# Patient Record
Sex: Female | Born: 1965 | Race: Black or African American | Hispanic: No | Smoking: Current every day smoker
Health system: Southern US, Community
[De-identification: ages and names within clinical notes are randomized; demographics above are authoritative.]

## PROBLEM LIST (undated history)

## (undated) DIAGNOSIS — F419 Anxiety disorder, unspecified: Secondary | ICD-10-CM

## (undated) DIAGNOSIS — F329 Major depressive disorder, single episode, unspecified: Secondary | ICD-10-CM

## (undated) DIAGNOSIS — F32A Depression, unspecified: Secondary | ICD-10-CM

## (undated) HISTORY — PX: HERNIA REPAIR: SHX51

---

## 1997-11-17 ENCOUNTER — Emergency Department (HOSPITAL_COMMUNITY): Admission: EM | Admit: 1997-11-17 | Discharge: 1997-11-17 | Payer: Self-pay | Admitting: Emergency Medicine

## 1997-11-19 ENCOUNTER — Encounter: Payer: Self-pay | Admitting: *Deleted

## 1997-11-19 ENCOUNTER — Emergency Department (HOSPITAL_COMMUNITY): Admission: EM | Admit: 1997-11-19 | Discharge: 1997-11-19 | Payer: Self-pay | Admitting: Emergency Medicine

## 1998-08-11 ENCOUNTER — Other Ambulatory Visit: Admission: RE | Admit: 1998-08-11 | Discharge: 1998-08-11 | Payer: Self-pay | Admitting: Family Medicine

## 1999-03-08 ENCOUNTER — Emergency Department (HOSPITAL_COMMUNITY): Admission: EM | Admit: 1999-03-08 | Discharge: 1999-03-08 | Payer: Self-pay | Admitting: Emergency Medicine

## 2000-04-03 ENCOUNTER — Ambulatory Visit (HOSPITAL_COMMUNITY): Admission: RE | Admit: 2000-04-03 | Discharge: 2000-04-03 | Payer: Self-pay | Admitting: General Surgery

## 2000-04-03 ENCOUNTER — Encounter (HOSPITAL_BASED_OUTPATIENT_CLINIC_OR_DEPARTMENT_OTHER): Payer: Self-pay | Admitting: General Surgery

## 2000-04-17 ENCOUNTER — Encounter (HOSPITAL_BASED_OUTPATIENT_CLINIC_OR_DEPARTMENT_OTHER): Payer: Self-pay | Admitting: General Surgery

## 2000-04-19 ENCOUNTER — Ambulatory Visit (HOSPITAL_COMMUNITY): Admission: RE | Admit: 2000-04-19 | Discharge: 2000-04-20 | Payer: Self-pay | Admitting: General Surgery

## 2000-09-05 ENCOUNTER — Emergency Department (HOSPITAL_COMMUNITY): Admission: EM | Admit: 2000-09-05 | Discharge: 2000-09-05 | Payer: Self-pay | Admitting: Emergency Medicine

## 2000-09-05 ENCOUNTER — Encounter: Payer: Self-pay | Admitting: Emergency Medicine

## 2000-10-08 ENCOUNTER — Emergency Department (HOSPITAL_COMMUNITY): Admission: EM | Admit: 2000-10-08 | Discharge: 2000-10-09 | Payer: Self-pay | Admitting: Emergency Medicine

## 2001-04-30 ENCOUNTER — Other Ambulatory Visit: Admission: RE | Admit: 2001-04-30 | Discharge: 2001-04-30 | Payer: Self-pay | Admitting: Obstetrics

## 2001-05-08 ENCOUNTER — Ambulatory Visit (HOSPITAL_COMMUNITY): Admission: RE | Admit: 2001-05-08 | Discharge: 2001-05-08 | Payer: Self-pay | Admitting: Obstetrics

## 2001-05-08 ENCOUNTER — Encounter: Payer: Self-pay | Admitting: Obstetrics

## 2001-06-17 ENCOUNTER — Emergency Department (HOSPITAL_COMMUNITY): Admission: EM | Admit: 2001-06-17 | Discharge: 2001-06-17 | Payer: Self-pay

## 2001-08-19 ENCOUNTER — Encounter: Payer: Self-pay | Admitting: Family Medicine

## 2001-08-19 ENCOUNTER — Encounter: Admission: RE | Admit: 2001-08-19 | Discharge: 2001-08-19 | Payer: Self-pay | Admitting: Family Medicine

## 2001-09-02 ENCOUNTER — Ambulatory Visit (HOSPITAL_COMMUNITY): Admission: RE | Admit: 2001-09-02 | Discharge: 2001-09-02 | Payer: Self-pay | Admitting: General Surgery

## 2001-10-17 ENCOUNTER — Encounter: Admission: RE | Admit: 2001-10-17 | Discharge: 2001-10-17 | Payer: Self-pay | Admitting: Family Medicine

## 2001-10-17 ENCOUNTER — Encounter: Payer: Self-pay | Admitting: Family Medicine

## 2002-03-06 ENCOUNTER — Inpatient Hospital Stay (HOSPITAL_COMMUNITY): Admission: EM | Admit: 2002-03-06 | Discharge: 2002-03-08 | Payer: Self-pay | Admitting: *Deleted

## 2002-10-08 ENCOUNTER — Other Ambulatory Visit: Admission: RE | Admit: 2002-10-08 | Discharge: 2002-10-08 | Payer: Self-pay | Admitting: Family Medicine

## 2003-03-24 ENCOUNTER — Encounter: Admission: RE | Admit: 2003-03-24 | Discharge: 2003-03-24 | Payer: Self-pay | Admitting: Family Medicine

## 2003-08-13 ENCOUNTER — Ambulatory Visit (HOSPITAL_BASED_OUTPATIENT_CLINIC_OR_DEPARTMENT_OTHER): Admission: RE | Admit: 2003-08-13 | Discharge: 2003-08-13 | Payer: Self-pay | Admitting: Family Medicine

## 2004-01-28 ENCOUNTER — Other Ambulatory Visit: Admission: RE | Admit: 2004-01-28 | Discharge: 2004-01-28 | Payer: Self-pay | Admitting: Family Medicine

## 2004-07-02 ENCOUNTER — Emergency Department (HOSPITAL_COMMUNITY): Admission: EM | Admit: 2004-07-02 | Discharge: 2004-07-02 | Payer: Self-pay | Admitting: Family Medicine

## 2005-04-18 ENCOUNTER — Other Ambulatory Visit: Admission: RE | Admit: 2005-04-18 | Discharge: 2005-04-18 | Payer: Self-pay | Admitting: Family Medicine

## 2005-05-10 ENCOUNTER — Encounter: Payer: Self-pay | Admitting: Obstetrics

## 2006-06-22 ENCOUNTER — Encounter: Admission: RE | Admit: 2006-06-22 | Discharge: 2006-06-22 | Payer: Self-pay | Admitting: Orthopedic Surgery

## 2006-10-27 ENCOUNTER — Emergency Department (HOSPITAL_COMMUNITY): Admission: EM | Admit: 2006-10-27 | Discharge: 2006-10-27 | Payer: Self-pay | Admitting: *Deleted

## 2006-12-11 ENCOUNTER — Emergency Department (HOSPITAL_COMMUNITY): Admission: EM | Admit: 2006-12-11 | Discharge: 2006-12-11 | Payer: Self-pay | Admitting: Emergency Medicine

## 2008-10-05 ENCOUNTER — Encounter: Admission: RE | Admit: 2008-10-05 | Discharge: 2008-10-05 | Payer: Self-pay | Admitting: Family Medicine

## 2009-05-06 ENCOUNTER — Encounter: Admission: RE | Admit: 2009-05-06 | Discharge: 2009-05-06 | Payer: Self-pay | Admitting: Family Medicine

## 2010-05-27 NOTE — Procedures (Signed)
NAME:  Nancy Santiago, Nancy Santiago       ACCOUNT NO.:  [id number]   MEDICAL RECORD NO.:  [id number]          PATIENT TYPE:  OUT   LOCATION:  SLEEP CENTER                 FACILITY:  [facility]   PHYSICIAN:  Clinton D. Maple Hudson, M.D. DATE OF BIRTH:  Jun 28, 1965   DATE OF ADMISSION:  08/13/2003  DATE OF DISCHARGE:  08/13/2003                              NOCTURNAL POLYSOMNOGRAM   REFERRING PHYSICIAN:  Renaye Rakers, M.D.   INDICATION FOR STUDY AND HISTORY:  Hypersomnia with sleep apnea, complaints  of snoring, night sweats, nightmares, difficulty initiating and maintaining  sleep, nonrestorative sleep, vivid dreams.   MEDICATIONS:  Valtrex, cranberry pill, Zoloft, multivitamins.   Epworth sleepiness score 2/24, neck size 18.2 inches, weight 107 pounds.   SLEEP ARCHITECTURE:  Total sleep time 364 minutes for sleep efficiency of  90%.  Stage I was 4%; stage II, 75%; stages III and IV were absent.  REM was  12% of total sleep time.  Latency to sleep onset was 11 minutes, latency to  REM 331 minutes, awake after sleep onset 28 minutes.  Arousal index 37.5  times per hour.  Arousals were mostly nonspecific, not clearly associated  with triggering events.  Technician noted that patient wanted to sleep under  multiple blankets, but technician also commented that patient appeared to be  too warm with sweating causing PEG artifact.  It may be able to approach  sleep hygiene on this issue.  Also note that work schedule gets rough at  3:30 a.m. on work days.   RESPIRATORY DATA:  Occasional sleep disorder breathing event within normal  limits. RDI was 1.6 per hour reflecting 8 hypopneas and 2 obstructive  apneas.  This is not felt to require medical attention.  Events were not  positional.  REM RDI was 1.3 per hour.   OXYGEN DATA:  Mild to moderate snoring noted occasionally.  Normal  oxygenation with mean saturation through the study between 96 and 97%.   CARDIAC DATA:  Normal cardiac rhythm, no significant  ectopics.   MOVEMENT AND PARASOMNIA:  There were 55 body jerks recorded of which 22 were  associated with arousals for a periodic limb movement with arousal index of  3.6 per hour. This may indicate mild  periodic limb movement syndrome.   IMPRESSION AND RECOMMENDATIONS:  A. Note comments above on sleep hygiene and impact of sleep schedule.  B. No significant sleep disorder breathing event.  C. Normal oxygenation and cardiac rhythm.  D. Periodic limb movement with arousal, 3.6 times per hour.  It may help to     treat this specifically; for example,     with Mirapex or Klonopin.  It may also be possible to treat this as part     of a nonspecific insomnia complaint with a standard sedative hypnotic.                                   ______________________________                                Rennis Chris. Maple Hudson, M.D.  Diplomate, American Board of Sleep Medicine    CDY/MEDQ  D:  08/16/2003 13:07:59  T:  08/17/2003 23:15:53  Job:  [id number]

## 2010-05-27 NOTE — Op Note (Signed)
   NAME:  Nancy Santiago, DEDEAUX                 ACCOUNT NO.:  [id number]   MEDICAL RECORD NO.:  [id number]                   PATIENT TYPE:  OIB   LOCATION:  2899                                 FACILITY:  [facility]   PHYSICIAN:  Mardene Celeste. Lurene Shadow, M.D.             DATE OF BIRTH:  [birth year]   DATE OF PROCEDURE:  09/02/2001  DATE OF DISCHARGE:                                 OPERATIVE REPORT   PREOPERATIVE DIAGNOSIS:  Suture granuloma of umbilicus with contained  sutures.   POSTOPERATIVE DIAGNOSIS:  Suture granuloma of umbilicus with contained  sutures.   PROCEDURE:  Excision of suture granuloma and removal of sutures from the  umbilicus.   SURGEON:  Mardene Celeste. Lurene Shadow, M.D.   ASSISTANT:  Nurse.   ANESTHESIA:  General.   CLINICAL NOTE:  The patient is a 45 year old woman who underwent repair of  umbilical hernia in the past.  She returns now with painful umbilicus and a  nodule within the umbilicus felt to be a suture granuloma.  The risks and  benefits of surgery have been discussed.  She understands and gives consent.  She is brought now to the operating room for excision of umbilical mass.   DESCRIPTION OF PROCEDURE:  Following the induction of satisfactory general  anesthesia, the patient was positioned supinely and the abdomen is prepped  and draped routinely.  I made an elliptical incision around the region of  the umbilical mass, deepened this through the skin and subcutaneous tissue,  taking the dissection down to the previous hernia repair.  The entire  granuloma was excised and then four individual sutures were removed.  The  fascia remained intact.  Hemostasis was obtained with electrocautery.  Sponge and instrument counts were then verified.  The subcutaneous tissue is  closed with interrupted 2-0 Vicryl sutures and the skin closed with a  running Monocryl suture and then reinforced with Steri-Strips.  Sterile  dressings applied.  The anesthetic reversed.  The patient  removed from the  operating room to recovery room in stable condition.  She tolerated the  procedure well.                                               Mardene Celeste Lurene Shadow, M.D.    PLB/MEDQ  D:  09/02/2001  T:  09/04/2001  Job:  [id number]

## 2010-05-27 NOTE — Op Note (Signed)
[place]  Patient:    Nancy Santiago, Nancy Santiago                MRN: [id number]. Date: 04/19/00 Attending:  Luisa Hart L. Lurene Shadow, M.D.                           Operative Report  PREOPERATIVE DIAGNOSIS:  Ventral hernia.  POSTOPERATIVE DIAGNOSIS:  Ventral hernia.  OPERATION PERFORMED:  Repair of ventral hernia.  SURGEON:  Mardene Celeste. Lurene Shadow, M.D.  ASSISTANT:  Nurse.  ANESTHESIA:  General.  INDICATIONS FOR PROCEDURE:  The patient is a 45 year old woman who is status post a laparoscopy who presents now with a defect just underneath the umbilicus.  It has just become painful with several episodes of incarceration of what I thought to be omentum.  She has come to the operating room now for repair.  DESCRIPTION OF PROCEDURE:  Following the induction of anesthesia with the patient positioned supinely, the abdomen was prepped and draped routinely.  I made an infraumbilical incision which was deepened through the skin and subcutaneous tissues dissecting down along the umbilical skin down to the hernial sac which was opened.  She had a defect which was approximately the size of the tip of the thumb.  The abdomen was quite lax and there was very little tension between the edges of the sac, so I decided not to use a mesh. I used interrupted inverted mattress sutures of 0 Novofil to repair the hernia, this being repaired in approximately four to five stitches.  Sponge, instrument and sharp counts were verified.  Skin closed with a running suture of #4-0 Monocryl.  Sterile dressings applied.  Anesthetic reversed.  Patient removed from the operating room to the recovery room in stable condition having tolerated the procedure well. DD:  04/19/00 TD:  04/19/00 Job: [id number]

## 2010-05-27 NOTE — Discharge Summary (Signed)
NAME:  Nancy Santiago, IMHOFF NO.:  [id number]   MEDICAL RECORD NO.:  [id number]                   PATIENT TYPE:  IPS   LOCATION:  0300                                 FACILITY:  [facility]   PHYSICIAN:  Jeanice Lim, M.D.              DATE OF BIRTH:  Jul 11, 1965   DATE OF ADMISSION:  03/06/2002  DATE OF DISCHARGE:  03/08/2002                                 DISCHARGE SUMMARY   IDENTIFYING DATA:  This is a 45 year old African-American female, married,  voluntarily admitted after an overdose with Ambien after arguing with her  husband.  She had lost her job on January 24, 2002 for saying that her  supervisor had lied.  As per patient, this is the first psychiatric  admission.  The patient described irritability, anger outbursts throughout  life and mood lability.   MEDICATIONS:  Effexor XR 150 mg q.a.m. and Famvir.   ALLERGIES:  No known drug allergies.   PHYSICAL EXAMINATION:  Essentially within normal limits.  Neurologically  nonfocal.   LABORATORY DATA:  Routine admission labs essentially within normal limits.   MENTAL STATUS EXAM:  Petite female with grumbling abdomen secondary to  charcoal.  Tearful affect.  Poor eye contact.  Speech within normal limits.  Mood depressed.  Affect irritable.  Thought processes goal directed.  Thought content negative for dangerous ideation, questionable suicidal  ideation, somewhat ambivalent and cognition was intact.  Judgment and  insight fair.   ADMISSION DIAGNOSES:   AXIS I:  1. Depressive disorder not otherwise specified.  2. Rule out bipolar disorder, type 2.   AXIS II:  None.   AXIS III:  1. Status post overdose.  2. Herpes II.   AXIS IV:  Moderate (stressors related to occupation and marriage).   AXIS V:  30/65.   HOSPITAL COURSE:  The patient was admitted and ordered routine p.r.n.  medications and underwent further monitoring.  Was encouraged to participate  in individual, group and milieu  therapy.  The patient was optimized on  Effexor targeting depressive symptoms and monitored for mood lability.  The  patient reported regretting the overdose, had wanted to hurt the husband  after the argument and this was an impulsive act, as per patient.  The  patient demonstrated safe behaviors while on the unit, participating in  treatment and reported a positive response to clinical intervention.  The  session with the husband went well.   CONDITION ON DISCHARGE:  Improved.  Mood was more euthymic.  Affect  brighter.  Thought processes goal directed.  Thought content negative for  dangerous ideation or psychotic symptoms.  The patient reported motivation  to be compliant with the aftercare plan.   DISCHARGE MEDICATIONS:  1. Effexor XR 150 mg q.a.m.  2. Effexor XR 37.5 mg at 3 p.m.   FOLLOW UP:  The patient was to follow up for medication management in 7-10  days.   DISCHARGE DIAGNOSES:   AXIS I:  1.  Depressive disorder not otherwise specified.  2. Rule out bipolar disorder, type 2.   AXIS II:  None.   AXIS III:  1. Status post overdose.  2. Herpes II.   AXIS IV:  Moderate (stressors related to occupation and marriage).   AXIS V:  Global Assessment of Functioning on discharge 55.                                               Jeanice Lim, M.D.    JEM/MEDQ  D:  03/17/2002  T:  03/18/2002  Job:  [id number]

## 2011-09-07 ENCOUNTER — Other Ambulatory Visit: Payer: Self-pay | Admitting: Family Medicine

## 2011-09-07 ENCOUNTER — Ambulatory Visit
Admission: RE | Admit: 2011-09-07 | Discharge: 2011-09-07 | Disposition: A | Discharge status: Home / Self Care | Payer: BC Managed Care – PPO | Source: Ambulatory Visit | Attending: Family Medicine | Admitting: Family Medicine

## 2011-09-07 DIAGNOSIS — M898X9 Other specified disorders of bone, unspecified site: Secondary | ICD-10-CM

## 2012-07-19 ENCOUNTER — Other Ambulatory Visit (HOSPITAL_COMMUNITY)
Admission: RE | Admit: 2012-07-19 | Discharge: 2012-07-19 | Disposition: A | Discharge status: Home / Self Care | Payer: BC Managed Care – PPO | Source: Ambulatory Visit | Attending: Family Medicine | Admitting: Family Medicine

## 2012-07-19 DIAGNOSIS — Z01419 Encounter for gynecological examination (general) (routine) without abnormal findings: Secondary | ICD-10-CM | POA: Insufficient documentation

## 2012-07-19 DIAGNOSIS — Z1151 Encounter for screening for human papillomavirus (HPV): Secondary | ICD-10-CM | POA: Insufficient documentation

## 2013-08-11 ENCOUNTER — Emergency Department (HOSPITAL_COMMUNITY)
Admission: EM | Admit: 2013-08-11 | Discharge: 2013-08-11 | Disposition: A | Discharge status: Home / Self Care | Payer: BC Managed Care – PPO | Attending: Emergency Medicine | Admitting: Emergency Medicine

## 2013-08-11 ENCOUNTER — Encounter (HOSPITAL_COMMUNITY): Payer: Self-pay | Admitting: Emergency Medicine

## 2013-08-11 ENCOUNTER — Emergency Department (HOSPITAL_COMMUNITY): Payer: BC Managed Care – PPO

## 2013-08-11 ENCOUNTER — Emergency Department (HOSPITAL_COMMUNITY)
Admission: EM | Admit: 2013-08-11 | Discharge: 2013-08-11 | Disposition: A | Discharge status: Nursing Home (Includes ICF) | Payer: BC Managed Care – PPO | Source: Home / Self Care | Attending: Emergency Medicine | Admitting: Emergency Medicine

## 2013-08-11 DIAGNOSIS — S2341XA Sprain of ribs, initial encounter: Secondary | ICD-10-CM | POA: Insufficient documentation

## 2013-08-11 DIAGNOSIS — Y929 Unspecified place or not applicable: Secondary | ICD-10-CM | POA: Insufficient documentation

## 2013-08-11 DIAGNOSIS — F17200 Nicotine dependence, unspecified, uncomplicated: Secondary | ICD-10-CM | POA: Insufficient documentation

## 2013-08-11 DIAGNOSIS — Y939 Activity, unspecified: Secondary | ICD-10-CM | POA: Insufficient documentation

## 2013-08-11 DIAGNOSIS — X58XXXA Exposure to other specified factors, initial encounter: Secondary | ICD-10-CM | POA: Insufficient documentation

## 2013-08-11 DIAGNOSIS — Z79899 Other long term (current) drug therapy: Secondary | ICD-10-CM | POA: Insufficient documentation

## 2013-08-11 DIAGNOSIS — R079 Chest pain, unspecified: Secondary | ICD-10-CM | POA: Insufficient documentation

## 2013-08-11 DIAGNOSIS — S29011A Strain of muscle and tendon of front wall of thorax, initial encounter: Secondary | ICD-10-CM

## 2013-08-11 DIAGNOSIS — Z8659 Personal history of other mental and behavioral disorders: Secondary | ICD-10-CM | POA: Insufficient documentation

## 2013-08-11 HISTORY — DX: Depression, unspecified: F32.A

## 2013-08-11 HISTORY — DX: Anxiety disorder, unspecified: F41.9

## 2013-08-11 HISTORY — DX: Major depressive disorder, single episode, unspecified: F32.9

## 2013-08-11 LAB — CBC
HCT: 37.9 % (ref 36.0–46.0)
Hemoglobin: 12.5 g/dL (ref 12.0–15.0)
MCH: 29.8 pg (ref 26.0–34.0)
MCHC: 33 g/dL (ref 30.0–36.0)
MCV: 90.5 fL (ref 78.0–100.0)
Platelets: 164 10*3/uL (ref 150–400)
RBC: 4.19 MIL/uL (ref 3.87–5.11)
RDW: 13.5 % (ref 11.5–15.5)
WBC: 8.4 10*3/uL (ref 4.0–10.5)

## 2013-08-11 LAB — BASIC METABOLIC PANEL
Anion gap: 11 (ref 5–15)
BUN: 11 mg/dL (ref 6–23)
CO2: 22 mEq/L (ref 19–32)
CREATININE: 0.72 mg/dL (ref 0.50–1.10)
Calcium: 8.4 mg/dL (ref 8.4–10.5)
Chloride: 107 mEq/L (ref 96–112)
Glucose, Bld: 83 mg/dL (ref 70–99)
POTASSIUM: 4.3 meq/L (ref 3.7–5.3)
Sodium: 140 mEq/L (ref 137–147)

## 2013-08-11 LAB — I-STAT TROPONIN, ED: Troponin i, poc: 0 ng/mL (ref 0.00–0.08)

## 2013-08-11 MED ORDER — NITROGLYCERIN 0.4 MG SL SUBL
SUBLINGUAL_TABLET | SUBLINGUAL | Status: AC
  Filled 2013-08-11: qty 1

## 2013-08-11 MED ORDER — SODIUM CHLORIDE 0.9 % IV SOLN
INTRAVENOUS | Status: DC
  Administered 2013-08-11: 11:00:00 via INTRAVENOUS

## 2013-08-11 MED ORDER — ASPIRIN 81 MG PO CHEW
CHEWABLE_TABLET | ORAL | Status: AC
  Filled 2013-08-11: qty 4

## 2013-08-11 MED ORDER — ASPIRIN 81 MG PO CHEW
324.0000 mg | CHEWABLE_TABLET | Freq: Once | ORAL | Status: AC
  Administered 2013-08-11: 324 mg via ORAL

## 2013-08-11 MED ORDER — HYDROCODONE-ACETAMINOPHEN 5-325 MG PO TABS: 1.0000 | ORAL_TABLET | Freq: Four times a day (QID) | ORAL | Status: DC | PRN

## 2013-08-11 MED ORDER — KETOROLAC TROMETHAMINE 30 MG/ML IJ SOLN
30.0000 mg | Freq: Once | INTRAMUSCULAR | Status: AC
  Administered 2013-08-11: 30 mg via INTRAVENOUS
  Filled 2013-08-11: qty 1

## 2013-08-11 MED ORDER — IBUPROFEN 800 MG PO TABS: 800.0000 mg | ORAL_TABLET | Freq: Three times a day (TID) | ORAL | Status: DC | PRN

## 2013-08-11 MED ORDER — NITROGLYCERIN 0.4 MG SL SUBL: 0.4000 mg | SUBLINGUAL_TABLET | SUBLINGUAL | Status: DC | PRN

## 2013-08-11 NOTE — ED Notes (Signed)
Patient c/o left side chest pain x 3 weeks. Patient reports pain is worst when she lifts her arms. Patient reports she lifts her grandson everyday up three flights of stairs and he's about 20-25 lbs. Patient is alert and oriented and in no acute distress.

## 2013-08-11 NOTE — ED Notes (Signed)
Patient placed on cardiac monitor, refused O2 asked that it be taken off due to making her "head feel funny"

## 2013-08-11 NOTE — Discharge Instructions (Signed)
Return here as needed. Follow up with your doctor. °

## 2013-08-11 NOTE — Discharge Instructions (Signed)
We have determined that your problem requires further evaluation in the emergency department.  We will take care of your transport there.  Once at the emergency department, you will be evaluated by a provider and they will order whatever treatment or tests they deem necessary.  We cannot guarantee that they will do any specific test or do any specific treatment.  ° °

## 2013-08-11 NOTE — ED Provider Notes (Signed)
Chief Complaint   Chief Complaint  Patient presents with  . Chest Pain    History of Present Illness    Nancy Santiago is a 48 year old female cigarette smoker whose had a three-week history of continuous but waxing and waning chest discomfort. The pain is localized to the entire left hemithorax. It's described as an 8/10 in intensity. She denies any injury. The pain is worse if she abducts her left arm and also in the morning when she first wakes up. The pain is nonexertional and nonpleuritic. She has had some cough productive of brown to green sputum. She denies any fever, chills, URI symptoms, or wheezing. She denies any shortness of breath at rest, but is a little bit more short of breath when she goes up 3 flights of stairs carrying her baby. She denies any other cardiac symptoms such as palpitations, dizziness, or syncope. No abdominal pain, nausea, vomiting, indigestion, heartburn. No extremity pain, paresthesias, swelling, or history of DVT or blood clots.  Review of Systems    Other than noted above, the patient denies any of the following symptoms. Systemic:  No fever or chills. Pulmonary:  No cough, wheezing, shortness of breath, sputum production, hemoptysis. Cardiac:  No palpitations, rapid heartbeat, dizziness, presyncope or syncope. GI:  No abdominal pain, heartburn, nausea, or vomiting. Ext:  No leg pain or swelling.  [place]    Past medical history, family history, social history, meds, and allergies were reviewed. Her only medication is Valtrex. She is a cigarette smoker.  Physical Exam     Vital signs:  BP 115/68  Pulse 76  Temp(Src) 98.4 F (36.9 C) (Oral)  Resp 18  SpO2 100% Gen:  Alert, oriented, in no distress, skin warm and dry. Eye:  PERRL, lids and conjunctivas normal.  Sclera non-icteric. ENT:  Mucous membranes moist, pharynx clear. Neck:  Supple, no adenopathy or tenderness.  No JVD. Lungs:  Clear to auscultation, no wheezes, rales or rhonchi.   No respiratory distress. Heart:  Regular rhythm.  No gallops, murmers, clicks or rubs. Chest:  No chest wall tenderness. Abdomen:  Soft, with mild tenderness to palpation in epigastrium and left upper quadrant with no guarding or rebound, no organomegaly or mass.  Bowel sounds normal.  No pulsatile abdominal mass or bruit. Ext:  No edema.  No calf tenderness and Homann's sign negative.  Pulses full and equal. Skin:  Warm and dry.  No rash.  EKG Results:  Date: 08/11/2013  Rate: 64  Rhythm: normal sinus rhythm  QRS Axis: normal--42  Intervals: normal  ST/T Wave abnormalities: nonspecific T wave changes  Conduction Disutrbances:none  Narrative Interpretation: Normal sinus rhythm, nonspecific T wave abnormalities.  Old EKG Reviewed: none available    Course in Urgent [place]         Began on IV normal saline, monitor, and nasal O2. Given nitroglycerin 0.4 mg sublingually and aspirin 325 mg by mouth.  Assessment     The encounter diagnosis was Chest pain, unspecified chest pain type.  Differential diagnosis is acute coronary syndrome, pulmonary embolism, ruptured aneurysm, pneumothorax, Boerhaave syndrome, pericarditis, musculoskeletal pain, or reflux esophagitis.   Plan     The patient was transferred to the ED via [place] in stable condition.  Medical Decision Making:  48 year old female smoker has a 3 week history of constant left hemithorax pain.  Non-exertional and non-pleuritic.  No shortness of breath, diaphoresis or nausea.  EKG shows non-specific T changes.  No prior history of heart disease, DVT, or PE.  Will give TNG, ASA, and transfer via [facility].        Harden Mo, MD 08/11/13 1051

## 2013-08-11 NOTE — ED Provider Notes (Signed)
CSN: [id number]     Arrival date & time 08/11/13  1136 History   First MD Initiated Contact with Patient 08/11/13 1219     Chief Complaint  Patient presents with  . Chest Pain     (Consider location/radiation/quality/duration/timing/severity/associated sxs/prior Treatment) HPI Patient presents to the emergency department with chest pain, has been constant for the last 3 weeks.  The patient, states, there's been no resolution at a time with her pain.  The patient, states, that she has not had any shortness of breath, headache, blurred vision, weakness, dizziness, back pain, neck pain, fever, cough, sore throat, runny nose nausea, vomiting, diarrhea, rash, dysuria, or syncope.  The patient, states, that movement makes the pain, worse.  Patient, states, that doesn't take any medications prior to arrival.  Nothing seems make her condition, better    Past Medical History  Diagnosis Date  . Depression   . Anxiety    Past Surgical History  Procedure Laterality Date  . Hernia repair     No family history on file. History  Substance Use Topics  . Smoking status: Current Every Day Smoker -- 0.50 packs/day    Types: Cigarettes  . Smokeless tobacco: Not on file  . Alcohol Use: Yes     Comment: occasional   OB History   Grav Para Term Preterm Abortions TAB SAB Ect Mult Living                 Review of Systems  All other systems negative except as documented in the HPI. All pertinent positives and negatives as reviewed in the HPI. All other systems negative except as documented in the HPI. All pertinent positives and negatives as reviewed in the HPI.  Allergies  Review of patient's allergies indicates no known allergies.  Home Medications   Prior to Admission medications   Medication Sig Start Date End Date Taking? Authorizing Provider  aspirin-acetaminophen-caffeine (EXCEDRIN MIGRAINE) [phone] MG per tablet Take 2 tablets by mouth every 6 (six) hours as needed for headache.    Yes Historical Provider, MD  ibuprofen (ADVIL,MOTRIN) 200 MG tablet Take 400 mg by mouth every 6 (six) hours as needed for moderate pain.   Yes Historical Provider, MD  valACYclovir (VALTREX) 500 MG tablet Take 500 mg by mouth daily.   Yes Historical Provider, MD   BP 92/57  Pulse 69  Temp(Src) 98 F (36.7 C) (Oral)  Resp 16  Ht 5' 4.5" (1.638 m)  Wt 125 lb (56.7 kg)  BMI 21.13 kg/m2  SpO2 98% Physical Exam  Constitutional: She is oriented to person, place, and time. She appears well-developed and well-nourished. No distress.  HENT:  Head: Normocephalic and atraumatic.  Mouth/Throat: Oropharynx is clear and moist.  Eyes: Pupils are equal, round, and reactive to light.  Neck: Normal range of motion. Neck supple.  Cardiovascular: Normal rate, regular rhythm and normal heart sounds.  Exam reveals no gallop and no friction rub.   No murmur heard. Pulmonary/Chest: Effort normal and breath sounds normal. She exhibits tenderness.    Neurological: She is alert and oriented to person, place, and time. She exhibits normal muscle tone. Coordination normal.  Skin: Skin is warm and dry. No erythema.    ED Course  Procedures (including critical care time) Labs Review Labs Reviewed  [place], ED    Imaging Review Dg Chest 2 View  08/11/2013   CLINICAL DATA:  Chest pain for 3 weeks radiating to left arm.  EXAM: CHEST  2 VIEW  COMPARISON:  None.  FINDINGS: Cardiomediastinal silhouette is unremarkable. The lungs are clear without pleural effusions or focal consolidations. Trachea projects midline and there is no pneumothorax. Soft tissue planes and included osseous structures are non-suspicious.  IMPRESSION: No acute cardiopulmonary process.  Normal chest radiograph.   Electronically Signed   By: Elon Alas   On: 08/11/2013 13:17     EKG Interpretation   Date/Time:  Monday August 11 2013 11:45:25 EDT Ventricular Rate:  60 PR Interval:  140 QRS  Duration: 82 QT Interval:  412 QTC Calculation: 412 R Axis:   11 Text Interpretation:  Sinus rhythm Borderline T wave abnormalities No  significant change since last tracing Confirmed by HARRISON  MD, FORREST  ([id number]) on 08/11/2013 11:49:22 AM        Patient Ms. Lopez musculoskeletal chest pain, based on the fact, that it's been constant over 3 weeks and she notices it more with movements.  Patient has been doing heavy lifting as well.  Patient's best return here as needed.  To followup with her primary care Dr. for urgent care.  Her testing here today did not show any signs of any cardiac involvement patient is PERC negative.  Brent General, PA-C 08/13/13 1955

## 2013-08-11 NOTE — ED Notes (Signed)
Pt [facility] transfer. Pt has CP x3 weeks, constant pain. 8/10, radiates to left arm. Given 1 nitro and 324 asa by UC, 500cc NS by EMS, no change in pain. Pt is AAOX4, in NAD, denies N/V/D, Diaphoresis, or SOB. Skin warm and dry.

## 2013-08-14 NOTE — ED Provider Notes (Signed)
Medical screening examination/treatment/procedure(s) were conducted as a shared visit with non-physician practitioner(s) and myself.  I personally evaluated the patient during the encounter.   EKG Interpretation   Date/Time:  Monday August 11 2013 11:45:25 EDT Ventricular Rate:  60 PR Interval:  140 QRS Duration: 82 QT Interval:  412 QTC Calculation: 412 R Axis:   11 Text Interpretation:  Sinus rhythm Borderline T wave abnormalities No  significant change since last tracing Confirmed by Ruhee Enck  MD, Taneal Sonntag  ([id number]) on 08/11/2013 11:49:22 AM      I interviewed and examined the patient. Lungs are CTAB. Cardiac exam wnl. Abdomen soft.  CP atypical and reproducible. Low risk per HEART score for MACE.   Pamella Pert, MD 08/14/13 1224

## 2013-11-07 ENCOUNTER — Encounter (HOSPITAL_BASED_OUTPATIENT_CLINIC_OR_DEPARTMENT_OTHER): Payer: Self-pay | Admitting: Emergency Medicine

## 2013-11-07 ENCOUNTER — Emergency Department (HOSPITAL_BASED_OUTPATIENT_CLINIC_OR_DEPARTMENT_OTHER)
Admission: EM | Admit: 2013-11-07 | Discharge: 2013-11-07 | Disposition: A | Discharge status: Home / Self Care | Payer: BC Managed Care – PPO | Attending: Emergency Medicine | Admitting: Emergency Medicine

## 2013-11-07 DIAGNOSIS — Z720 Tobacco use: Secondary | ICD-10-CM | POA: Insufficient documentation

## 2013-11-07 DIAGNOSIS — Z79899 Other long term (current) drug therapy: Secondary | ICD-10-CM | POA: Diagnosis not present

## 2013-11-07 DIAGNOSIS — F329 Major depressive disorder, single episode, unspecified: Secondary | ICD-10-CM | POA: Insufficient documentation

## 2013-11-07 DIAGNOSIS — M546 Pain in thoracic spine: Secondary | ICD-10-CM | POA: Insufficient documentation

## 2013-11-07 DIAGNOSIS — F419 Anxiety disorder, unspecified: Secondary | ICD-10-CM | POA: Insufficient documentation

## 2013-11-07 DIAGNOSIS — M549 Dorsalgia, unspecified: Secondary | ICD-10-CM | POA: Diagnosis present

## 2013-11-07 LAB — URINALYSIS, ROUTINE W REFLEX MICROSCOPIC
BILIRUBIN URINE: NEGATIVE
Glucose, UA: NEGATIVE mg/dL
Hgb urine dipstick: NEGATIVE
KETONES UR: NEGATIVE mg/dL
Leukocytes, UA: NEGATIVE
Nitrite: NEGATIVE
Protein, ur: NEGATIVE mg/dL
Specific Gravity, Urine: 1.022 (ref 1.005–1.030)
Urobilinogen, UA: 1 mg/dL (ref 0.0–1.0)
pH: 5 (ref 5.0–8.0)

## 2013-11-07 MED ORDER — CYCLOBENZAPRINE HCL 10 MG PO TABS: 10.0000 mg | ORAL_TABLET | Freq: Three times a day (TID) | ORAL | Status: DC | PRN

## 2013-11-07 NOTE — ED Provider Notes (Signed)
CSN: [id number]     Arrival date & time 11/07/13  9509 History   First MD Initiated Contact with Patient 11/07/13 [phone]     Chief Complaint  Patient presents with  . Back Pain     Patient is a 48 y.o. female presenting with back pain. The history is provided by the patient.  Back Pain Location:  Thoracic spine Quality:  Aching Radiates to:  Does not radiate Pain severity:  Moderate Onset quality:  Gradual Duration:  2 days Timing:  Constant Progression:  Worsening Chronicity:  New Relieved by:  Nothing Worsened by:  Movement Associated symptoms: no abdominal pain, no bladder incontinence, no bowel incontinence, no dysuria, no fever, no numbness and no weakness   Risk factors: no recent surgery     Past Medical History  Diagnosis Date  . Depression   . Anxiety   . Anxiety    Past Surgical History  Procedure Laterality Date  . Hernia repair    . Cesarean section     No family history on file. History  Substance Use Topics  . Smoking status: Current Every Day Smoker -- 0.50 packs/day    Types: Cigarettes  . Smokeless tobacco: Not on file  . Alcohol Use: Yes     Comment: occasional   OB History   Grav Para Term Preterm Abortions TAB SAB Ect Mult Living                 Review of Systems  Constitutional: Negative for fever.  Gastrointestinal: Negative for abdominal pain and bowel incontinence.  Genitourinary: Negative for bladder incontinence and dysuria.  Musculoskeletal: Positive for back pain.  Neurological: Negative for weakness and numbness.      Allergies  Review of patient's allergies indicates no known allergies.  Home Medications   Prior to Admission medications   Medication Sig Start Date End Date Taking? Authorizing Provider  UNKNOWN TO PATIENT    Yes Historical Provider, MD  cyclobenzaprine (FLEXERIL) 10 MG tablet Take 1 tablet (10 mg total) by mouth 3 (three) times daily as needed for muscle spasms. 11/07/13   Sharyon Cable, MD   BP  111/68  Pulse 81  Temp(Src) 98 F (36.7 C) (Oral)  Resp 16  Ht 5\' 4"  (1.626 m)  Wt 125 lb (56.7 kg)  BMI 21.45 kg/m2  SpO2 99% Physical Exam CONSTITUTIONAL: Well developed/well nourished HEAD: Normocephalic/atraumatic EYES: EOMI/PERRL ENMT: Mucous membranes moist NECK: supple no meningeal signs SPINE:thoracic spine and paraspinal tenderness.  No bruising/erythema/stepoffs No bruising/crepitance/stepoffs noted to spine CV: S1/S2 noted, no murmurs/rubs/gallops noted LUNGS: Lungs are clear to auscultation bilaterally, no apparent distress ABDOMEN: soft, nontender, no rebound or guarding GU:no cva tenderness NEURO: Awake/alert,  equal motor 5/5 strength noted with the following: hip flexion/knee flexion/extension, foot dorsi/plantar flexion, great toe extension intact bilaterally, no clonus bilaterally,  no sensory deficit in any dermatome.  Equal patellar/achilles reflex noted (2+) in bilateral lower extremities.  Pt is able to ambulate unassisted. EXTREMITIES: pulses normal, full ROM SKIN: warm, color normal PSYCH: no abnormalities of mood noted   ED Course  Procedures   Pt well appearing, ambulatory no distress and appropriate for d/c home Advised of strict return precautions Advised need for PCP evaluation next week if no improvement  Labs Review Labs Reviewed  URINALYSIS, ROUTINE W REFLEX MICROSCOPIC    MDM   Final diagnoses:  Midline thoracic back pain    Nursing notes including past medical history and social history reviewed and considered in documentation  Labs/vital reviewed and considered     Sharyon Cable, MD 11/07/13 1045

## 2013-11-07 NOTE — Discharge Instructions (Signed)

## 2013-11-07 NOTE — ED Notes (Signed)
Mid back pain x 2 days without injury.  Denies urinary symptoms or weakness.

## 2013-11-07 NOTE — ED Notes (Signed)
MD at bedside. 

## 2014-07-17 ENCOUNTER — Other Ambulatory Visit (HOSPITAL_COMMUNITY): Payer: Self-pay | Admitting: Family Medicine

## 2014-07-17 DIAGNOSIS — E041 Nontoxic single thyroid nodule: Secondary | ICD-10-CM

## 2014-07-27 ENCOUNTER — Ambulatory Visit (HOSPITAL_COMMUNITY)
Admission: RE | Admit: 2014-07-27 | Discharge: 2014-07-27 | Disposition: A | Discharge status: Home / Self Care | Payer: BLUE CROSS/BLUE SHIELD | Source: Ambulatory Visit | Attending: Family Medicine | Admitting: Family Medicine

## 2014-07-27 DIAGNOSIS — E041 Nontoxic single thyroid nodule: Secondary | ICD-10-CM | POA: Insufficient documentation

## 2014-07-27 MED ORDER — SODIUM IODIDE I 131 CAPSULE
12.0000 | Freq: Once | INTRAVENOUS | Status: AC | PRN
  Administered 2014-07-27: 12.1 via ORAL

## 2014-07-28 MED ORDER — SODIUM PERTECHNETATE TC 99M INJECTION: 10.0000 | Freq: Once | INTRAVENOUS | Status: AC | PRN

## 2014-09-25 ENCOUNTER — Emergency Department (HOSPITAL_COMMUNITY)
Admission: EM | Admit: 2014-09-25 | Discharge: 2014-09-25 | Disposition: A | Discharge status: Home / Self Care | Payer: BLUE CROSS/BLUE SHIELD | Attending: Emergency Medicine | Admitting: Emergency Medicine

## 2014-09-25 ENCOUNTER — Encounter (HOSPITAL_COMMUNITY): Payer: Self-pay | Admitting: Emergency Medicine

## 2014-09-25 DIAGNOSIS — Y9289 Other specified places as the place of occurrence of the external cause: Secondary | ICD-10-CM | POA: Diagnosis not present

## 2014-09-25 DIAGNOSIS — Z720 Tobacco use: Secondary | ICD-10-CM | POA: Insufficient documentation

## 2014-09-25 DIAGNOSIS — Z8659 Personal history of other mental and behavioral disorders: Secondary | ICD-10-CM | POA: Insufficient documentation

## 2014-09-25 DIAGNOSIS — S0502XA Injury of conjunctiva and corneal abrasion without foreign body, left eye, initial encounter: Secondary | ICD-10-CM | POA: Diagnosis not present

## 2014-09-25 DIAGNOSIS — Y998 Other external cause status: Secondary | ICD-10-CM | POA: Diagnosis not present

## 2014-09-25 DIAGNOSIS — Y9389 Activity, other specified: Secondary | ICD-10-CM | POA: Diagnosis not present

## 2014-09-25 DIAGNOSIS — H578 Other specified disorders of eye and adnexa: Secondary | ICD-10-CM | POA: Diagnosis present

## 2014-09-25 DIAGNOSIS — X58XXXA Exposure to other specified factors, initial encounter: Secondary | ICD-10-CM | POA: Diagnosis not present

## 2014-09-25 MED ORDER — POLYMYXIN B-TRIMETHOPRIM 10000-0.1 UNIT/ML-% OP SOLN: 1.0000 [drp] | OPHTHALMIC | Status: DC

## 2014-09-25 NOTE — ED Provider Notes (Signed)
CSN: [id number]     Arrival date & time 09/25/14  1846 History  This chart was scribed for non-physician practitioner, Alvina Chou, PA-C working with Noemi Chapel, MD by Hansel Feinstein, ED scribe. This patient was seen in room WTR7/WTR7 and the patient's care was started at 8:20 PM    Chief Complaint  Patient presents with  . Pressure Behind the Eyes   The history is provided by the patient. No language interpreter was used.    HPI Comments: Nancy Santiago is a 49 y.o. female who presents to the Emergency Department complaining of moderate pressure behind the left eye onset 2 days ago with associated pain onset today, tearing, slightly decreased vision. She notes pain is worsened when she looks to the right, when she opens her eyes. No Hx of similar symptoms, glaucoma. She denies any sensation of foreign body.   Past Medical History  Diagnosis Date  . Depression   . Anxiety   . Anxiety    Past Surgical History  Procedure Laterality Date  . Hernia repair    . Cesarean section     History reviewed. No pertinent family history. Social History  Substance Use Topics  . Smoking status: Current Every Day Smoker -- 0.50 packs/day    Types: Cigarettes  . Smokeless tobacco: None  . Alcohol Use: Yes     Comment: occasional   OB History    No data available     Review of Systems  Eyes: Positive for pain, discharge ( tears) and visual disturbance.       +pressure -foreign body sensation   All other systems reviewed and are negative.  Allergies  Review of patient's allergies indicates no known allergies.  Home Medications   Prior to Admission medications   Medication Sig Start Date End Date Taking? Authorizing Provider  cyclobenzaprine (FLEXERIL) 10 MG tablet Take 1 tablet (10 mg total) by mouth 3 (three) times daily as needed for muscle spasms. 11/07/13   Ripley Fraise, MD  UNKNOWN TO PATIENT     Historical Provider, MD   BP 117/78 mmHg  Pulse 91  Temp(Src) 98.4 F  (36.9 C) (Oral)  Resp 17  SpO2 100% Physical Exam  Constitutional: She is oriented to person, place, and time. She appears well-developed and well-nourished.  HENT:  Head: Normocephalic and atraumatic.  Eyes: Conjunctivae and EOM are normal. Pupils are equal, round, and reactive to light.  Slit lamp exam:      The left eye shows corneal abrasion.  Corneal abrasion at the 3 oclock position of the iris. Peripheral vision intact bilaterally.         Visual Acuity  Right Eye Distance: 20/25 Left Eye Distance: 20/40 Bilateral Distance: 20/20  Left eye IOP: 21  Neck: Normal range of motion. Neck supple.  Cardiovascular: Normal rate.   Pulmonary/Chest: Effort normal. No respiratory distress.  Abdominal: She exhibits no distension.  Musculoskeletal: Normal range of motion.  Neurological: She is alert and oriented to person, place, and time. Coordination normal.  Skin: Skin is warm and dry.  Psychiatric: She has a normal mood and affect. Her behavior is normal.  Nursing note and vitals reviewed.   ED Course  Procedures (including critical care time) DIAGNOSTIC STUDIES: Oxygen Saturation is 100% on RA, normal by my interpretation.    COORDINATION OF CARE: 8:23 PM Discussed treatment plan with pt at bedside and pt agreed to plan.   Labs Review Labs Reviewed - No data to display  Imaging Review No  results found. I have personally reviewed and evaluated these images and lab results as part of my medical decision-making.   EKG Interpretation None      MDM   Final diagnoses:  Corneal abrasion, left, initial encounter    9:13 PM Patient has a corneal abrasion on the left cornea. Peripheral visual fields intact. PERRLA and no conjunctivitis. No concern for glaucoma at this time. Patient will be discharged with polytrim.   I personally performed the services described in this documentation, which was scribed in my presence. The recorded information has been reviewed and is  accurate.   Alvina Chou, PA-C 09/25/14 2115  Noemi Chapel, MD 09/26/14 [phone]

## 2014-09-25 NOTE — ED Notes (Signed)
Pt c/o pressure behind left eye. Says, "Like when I turn my eyes to the right it feels like someone is trying to rip my left eye out." Denies any precipitating events. No other c/c.

## 2014-09-25 NOTE — ED Notes (Signed)
AVS explained in detail. Knows to follow up with eye doctor and to take eye drops as prescribed. No other c/c.

## 2014-09-25 NOTE — Discharge Instructions (Signed)
Use antibiotic eye drops as directed. Refer to attached documents for more information.

## 2015-01-24 ENCOUNTER — Encounter (HOSPITAL_COMMUNITY): Payer: Self-pay | Admitting: Emergency Medicine

## 2015-01-24 ENCOUNTER — Emergency Department (INDEPENDENT_AMBULATORY_CARE_PROVIDER_SITE_OTHER)
Admission: EM | Admit: 2015-01-24 | Discharge: 2015-01-24 | Disposition: A | Discharge status: Home / Self Care | Payer: BLUE CROSS/BLUE SHIELD | Source: Home / Self Care | Attending: Emergency Medicine | Admitting: Emergency Medicine

## 2015-01-24 DIAGNOSIS — M545 Low back pain, unspecified: Secondary | ICD-10-CM

## 2015-01-24 MED ORDER — PREDNISONE 50 MG PO TABS: ORAL_TABLET | ORAL | Status: DC

## 2015-01-24 NOTE — ED Provider Notes (Signed)
CSN: [id number]     Arrival date & time 01/24/15  1314 History   First MD Initiated Contact with Patient 01/24/15 1505     Chief Complaint  Patient presents with  . Back Pain   (Consider location/radiation/quality/duration/timing/severity/associated sxs/prior Treatment) HPI  She is a 50 year old woman here for evaluation of back pain. She states she has chronic back pain that is typically well controlled. However, on Tuesday she slipped on some ice falling on her bottom. Since that time she has had increased pain across her lower back. She denies any radiating pain. No numbness, tingling, weakness. No bowel or bladder incontinence. She has been using her muscle relaxer with some improvement. She is planning on returning to work tomorrow, but her job needs a turn to work note.  Past Medical History  Diagnosis Date  . Depression   . Anxiety   . Anxiety    Past Surgical History  Procedure Laterality Date  . Hernia repair    . Cesarean section     No family history on file. Social History  Substance Use Topics  . Smoking status: Current Every Day Smoker -- 0.50 packs/day    Types: Cigarettes  . Smokeless tobacco: None  . Alcohol Use: Yes     Comment: occasional   OB History    No data available     Review of Systems Abdomen history of present illness Allergies  Review of patient's allergies indicates no known allergies.  Home Medications   Prior to Admission medications   Medication Sig Start Date End Date Taking? Authorizing Provider  valACYclovir (VALTREX) 1000 MG tablet Take 1,000 mg by mouth 2 (two) times daily.   Yes Historical Provider, MD  cyclobenzaprine (FLEXERIL) 10 MG tablet Take 1 tablet (10 mg total) by mouth 3 (three) times daily as needed for muscle spasms. 11/07/13   Ripley Fraise, MD  predniSONE (DELTASONE) 50 MG tablet Take 1 pill daily for 5 days. 01/24/15   Melony Overly, MD  trimethoprim-polymyxin b (POLYTRIM) ophthalmic solution Place 1 drop into the  left eye every 4 (four) hours. 09/25/14   Alvina Chou, PA-C  UNKNOWN TO PATIENT     Historical Provider, MD   Meds Ordered and Administered this Visit  Medications - No data to display  BP 118/73 mmHg  Pulse 91  Temp(Src) 98.1 F (36.7 C) (Oral)  Resp 16  SpO2 99% No data found.   Physical Exam  Constitutional: She is oriented to person, place, and time. She appears well-developed and well-nourished. No distress.  Cardiovascular: Normal rate.   Pulmonary/Chest: Effort normal.  Musculoskeletal:  Back: No erythema or edema. She is mildly tender to palpation across her lower back. No vertebral step-offs or tenderness. Negative straight leg raise bilaterally.  Neurological: She is alert and oriented to person, place, and time.    ED Course  Procedures (including critical care time)  Labs Review Labs Reviewed - No data to display  Imaging Review No results found.    MDM   1. Bilateral low back pain without sciatica    We'll treat with 5 days of prednisone. Recommended switching from ice to heat at this time. Return to work note given.    Melony Overly, MD 01/24/15 [phone]

## 2015-01-24 NOTE — Discharge Instructions (Signed)
I'm sorry your back is bothering you. Take prednisone daily for the next 5 days to help bring down the inflammation. At this time, you can stop doing ice and start doing heat. Continue your Flexeril, ibuprofen, and patches as needed. Follow-up as needed.

## 2015-01-24 NOTE — ED Notes (Signed)
Pt reports she slipped on ice on Tuesday, 1/10, inj her lower back  Reports hx of chronic back pain.... Missed x1 day of work and now is needing a note to return Slow gait... A&O x4... No acute distress.

## 2015-10-08 ENCOUNTER — Other Ambulatory Visit: Payer: Self-pay | Admitting: Family Medicine

## 2015-10-08 DIAGNOSIS — N644 Mastodynia: Secondary | ICD-10-CM

## 2016-03-07 ENCOUNTER — Ambulatory Visit (INDEPENDENT_AMBULATORY_CARE_PROVIDER_SITE_OTHER): Payer: BLUE CROSS/BLUE SHIELD

## 2016-03-07 ENCOUNTER — Encounter (HOSPITAL_COMMUNITY): Payer: Self-pay | Admitting: Emergency Medicine

## 2016-03-07 ENCOUNTER — Ambulatory Visit (HOSPITAL_COMMUNITY)
Admission: EM | Admit: 2016-03-07 | Discharge: 2016-03-07 | Disposition: A | Discharge status: Home / Self Care | Payer: BLUE CROSS/BLUE SHIELD | Attending: Family Medicine | Admitting: Family Medicine

## 2016-03-07 DIAGNOSIS — S93602A Unspecified sprain of left foot, initial encounter: Secondary | ICD-10-CM | POA: Diagnosis not present

## 2016-03-07 DIAGNOSIS — M79605 Pain in left leg: Secondary | ICD-10-CM

## 2016-03-07 NOTE — ED Provider Notes (Signed)
CSN: [id number]     Arrival date & time 03/07/16  1251 History   None    Chief Complaint  Patient presents with  . Toe Injury    left 4th   (Consider location/radiation/quality/duration/timing/severity/associated sxs/prior Treatment) Patient c/o stubbed left toe.   The history is provided by the patient.  Foot Injury  Location:  Toe Time since incident:  9 days Injury: yes   Pain details:    Quality:  Aching   Radiates to:  Does not radiate   Severity:  Moderate   Past Medical History:  Diagnosis Date  . Anxiety   . Anxiety   . Depression    Past Surgical History:  Procedure Laterality Date  . CESAREAN SECTION    . HERNIA REPAIR     History reviewed. No pertinent family history. Social History  Substance Use Topics  . Smoking status: Current Every Day Smoker    Packs/day: 0.50    Types: Cigarettes  . Smokeless tobacco: Never Used  . Alcohol use Yes     Comment: occasional   OB History    No data available     Review of Systems  Constitutional: Negative.   HENT: Negative.   Eyes: Negative.   Respiratory: Negative.   Cardiovascular: Negative.   Gastrointestinal: Negative.   Endocrine: Negative.   Genitourinary: Negative.   Musculoskeletal: Negative.   Allergic/Immunologic: Negative.   Neurological: Negative.   Psychiatric/Behavioral: Negative.     Allergies  Patient has no known allergies.  Home Medications   Prior to Admission medications   Medication Sig Start Date End Date Taking? Authorizing Provider  cyclobenzaprine (FLEXERIL) 10 MG tablet Take 1 tablet (10 mg total) by mouth 3 (three) times daily as needed for muscle spasms. 11/07/13   Ripley Fraise, MD  predniSONE (DELTASONE) 50 MG tablet Take 1 pill daily for 5 days. 01/24/15   Melony Overly, MD  trimethoprim-polymyxin b (POLYTRIM) ophthalmic solution Place 1 drop into the left eye every 4 (four) hours. 09/25/14   Alvina Chou, PA-C  UNKNOWN TO PATIENT     Historical Provider, MD   valACYclovir (VALTREX) 1000 MG tablet Take 1,000 mg by mouth 2 (two) times daily.    Historical Provider, MD   Meds Ordered and Administered this Visit  Medications - No data to display  BP 112/64 (BP Location: Left Arm)   Pulse 84   Temp 98.1 F (36.7 C) (Oral)   SpO2 100%  No data found.   Physical Exam  Urgent Care Course     Procedures (including critical care time)  Labs Review Labs Reviewed - No data to display  Imaging Review Dg Foot Complete Left  Result Date: 03/07/2016 CLINICAL DATA:  Injured fourth toe 10 days ago. Persistent pain and swelling. EXAM: LEFT FOOT - COMPLETE 3+ VIEW COMPARISON:  None. FINDINGS: The joint spaces are maintained.  No acute fracture is identified. IMPRESSION: No acute bony findings. Electronically Signed   By: Marijo Sanes M.D.   On: 03/07/2016 13:43     Visual Acuity Review  Right Eye Distance:   Left Eye Distance:   Bilateral Distance:    Right Eye Near:   Left Eye Near:    Bilateral Near:         MDM   1. Pain of left lower extremity   2. Foot sprain, left, initial encounter    Tylenol and motrin otc Cam walker      Lysbeth Penner, FNP 03/07/16 2030

## 2016-03-07 NOTE — ED Triage Notes (Signed)
Pt stubbed her 4th left toe 9 days ago.  Since then she has been able to walk on it, but the pain has been getting progressively worse and she complains of pain in the foot as well.

## 2017-04-14 ENCOUNTER — Encounter (HOSPITAL_COMMUNITY): Payer: Self-pay | Admitting: *Deleted

## 2017-04-14 ENCOUNTER — Emergency Department (HOSPITAL_COMMUNITY)
Admission: EM | Admit: 2017-04-14 | Discharge: 2017-04-15 | Disposition: A | Discharge status: Home / Self Care | Payer: 59 | Attending: Emergency Medicine | Admitting: Emergency Medicine

## 2017-04-14 ENCOUNTER — Other Ambulatory Visit: Payer: Self-pay

## 2017-04-14 ENCOUNTER — Emergency Department (HOSPITAL_COMMUNITY): Payer: 59

## 2017-04-14 DIAGNOSIS — F17210 Nicotine dependence, cigarettes, uncomplicated: Secondary | ICD-10-CM | POA: Diagnosis not present

## 2017-04-14 DIAGNOSIS — Y999 Unspecified external cause status: Secondary | ICD-10-CM | POA: Diagnosis not present

## 2017-04-14 DIAGNOSIS — Y9301 Activity, walking, marching and hiking: Secondary | ICD-10-CM | POA: Diagnosis not present

## 2017-04-14 DIAGNOSIS — S82831A Other fracture of upper and lower end of right fibula, initial encounter for closed fracture: Secondary | ICD-10-CM | POA: Insufficient documentation

## 2017-04-14 DIAGNOSIS — Z79899 Other long term (current) drug therapy: Secondary | ICD-10-CM | POA: Insufficient documentation

## 2017-04-14 DIAGNOSIS — S99911A Unspecified injury of right ankle, initial encounter: Secondary | ICD-10-CM | POA: Diagnosis present

## 2017-04-14 DIAGNOSIS — W108XXA Fall (on) (from) other stairs and steps, initial encounter: Secondary | ICD-10-CM | POA: Diagnosis not present

## 2017-04-14 DIAGNOSIS — Y92009 Unspecified place in unspecified non-institutional (private) residence as the place of occurrence of the external cause: Secondary | ICD-10-CM | POA: Diagnosis not present

## 2017-04-14 MED ORDER — HYDROCODONE-ACETAMINOPHEN 5-325 MG PO TABS
1.0000 | ORAL_TABLET | Freq: Once | ORAL | Status: AC
  Administered 2017-04-14: 1 via ORAL
  Filled 2017-04-14: qty 1

## 2017-04-14 MED ORDER — HYDROCODONE-ACETAMINOPHEN 5-325 MG PO TABS: 2.0000 | ORAL_TABLET | Freq: Four times a day (QID) | ORAL | 0 refills | Status: DC | PRN

## 2017-04-14 MED ORDER — IBUPROFEN 800 MG PO TABS: 800.0000 mg | ORAL_TABLET | Freq: Three times a day (TID) | ORAL | 0 refills | Status: DC

## 2017-04-14 NOTE — ED Provider Notes (Signed)
[facility] EMERGENCY DEPARTMENT Provider Note   CSN: [id number] Arrival date & time: 04/14/17  2128     History   Chief Complaint Chief Complaint  Patient presents with  . Ankle Pain    HPI Nancy Santiago is a 52 y.o. female presenting for evaluation of right ankle pain.  Patient states that she tripped going down the last 3 stairs.  She felt a pop of her right ankle and had acute onset pain.  She has been unable to ambulate since.  She denies radiation of the pain.  She denies hitting her head or loss of consciousness.  No neck or back pain.  She denies injury elsewhere.  She has not taken anything for pain including Tylenol or ibuprofen.  This occurred just prior to arrival.  She denies numbness or tingling.  Has no other medical problems, does not take medications daily.  She does not have an orthopedic doctor.  HPI  Past Medical History:  Diagnosis Date  . Anxiety   . Anxiety   . Depression     There are no active problems to display for this patient.   Past Surgical History:  Procedure Laterality Date  . CESAREAN SECTION    . HERNIA REPAIR       OB History   None      Home Medications    Prior to Admission medications   Medication Sig Start Date End Date Taking? Authorizing Provider  cyclobenzaprine (FLEXERIL) 10 MG tablet Take 1 tablet (10 mg total) by mouth 3 (three) times daily as needed for muscle spasms. 11/07/13   Ripley Fraise, MD  HYDROcodone-acetaminophen (NORCO/VICODIN) 5-325 MG tablet Take 2 tablets by mouth every 6 (six) hours as needed for severe pain. 04/14/17   Evee Liska, PA-C  ibuprofen (ADVIL,MOTRIN) 800 MG tablet Take 1 tablet (800 mg total) by mouth 3 (three) times daily with meals. 04/14/17   Tambra Muller, PA-C  predniSONE (DELTASONE) 50 MG tablet Take 1 pill daily for 5 days. 01/24/15   Melony Overly, MD  trimethoprim-polymyxin b (POLYTRIM) ophthalmic solution Place 1 drop into the left eye every 4  (four) hours. 09/25/14   Alvina Chou, PA-C  UNKNOWN TO PATIENT     [provider]  valACYclovir (VALTREX) 1000 MG tablet Take 1,000 mg by mouth 2 (two) times daily.    [provider]    Family History No family history on file.  Social History Social History   Tobacco Use  . Smoking status: Current Every Day Smoker    Packs/day: 0.50    Types: Cigarettes  . Smokeless tobacco: Never Used  Substance Use Topics  . Alcohol use: Yes    Comment: occasional  . Drug use: No     Allergies   Patient has no known allergies.   Review of Systems Review of Systems  Musculoskeletal: Positive for arthralgias and joint swelling.  Neurological: Negative for numbness.  Hematological: Does not bruise/bleed easily.     Physical Exam Updated Vital Signs BP 120/83 (BP Location: Left Arm)   Pulse 90   Temp 98 F (36.7 C) (Oral)   Resp 14   Ht 5\' 4"  (1.626 m)   Wt 58.1 kg (128 lb)   SpO2 96%   BMI 21.97 kg/m   Physical Exam  Constitutional: She is oriented to person, place, and time. She appears well-developed and well-nourished. No distress.  HENT:  Head: Normocephalic and atraumatic.  Eyes: EOM are normal.  Neck: Normal range  of motion.  Pulmonary/Chest: Effort normal.  Abdominal: She exhibits no distension.  Musculoskeletal: She exhibits edema and tenderness.  Significant swelling of the lateral aspect of the right ankle with mild contusion.  Tenderness to palpation of lateral malleolus.  No tenderness to palpation the medial malleolus.  Achilles tendon intact.  No tenderness to palpation of the foot.  Pedal pulses intact bilaterally.  Color and warmth equal bilaterally.  Sensation intact bilaterally.  No tenderness of the knee or proximal lower leg.  Neurological: She is alert and oriented to person, place, and time. No sensory deficit.  Skin: Skin is warm. No rash noted.  Psychiatric: She has a normal mood and affect.  Nursing note and vitals  reviewed.    ED Treatments / Results  Labs (all labs ordered are listed, but only abnormal results are displayed) Labs Reviewed - No data to display  EKG None  Radiology Dg Ankle Complete Right  Result Date: 04/14/2017 CLINICAL DATA:  Fall.  Ankle swelling. EXAM: RIGHT ANKLE - COMPLETE 3+ VIEW COMPARISON:  None. FINDINGS: Three views study shows a transverse fracture of the distal fibula, distal to the ankle mortise. No associated distal tibia fracture evident. Ankle mortise is preserved. No worrisome lytic or sclerotic osseous abnormality. Lateral soft tissue swelling evident. IMPRESSION: Transverse fracture of the fibula, distal to the ankle mortise. Electronically Signed   By: Misty Stanley M.D.   On: 04/14/2017 22:01    Procedures Procedures (including critical care time)  Medications Ordered in ED Medications  HYDROcodone-acetaminophen (NORCO/VICODIN) 5-325 MG per tablet 1 tablet (1 tablet Oral Given 04/14/17 2313)     Initial Impression / Assessment and Plan / ED Course  I have reviewed the triage vital signs and the nursing notes.  Pertinent labs & imaging results that were available during my care of the patient were reviewed by me and considered in my medical decision making (see chart for details).     Patient presenting for evaluation of right ankle pain.  Physical exam shows patient who is neurovascularly intact.  X-ray viewed and interpreted by me, shows distal fracture of the fibula.  Mortise intact.  No pain of the proximal lower leg, doubt proximal fibular fracture.  Discussed findings with patient.  Discussed treatment with Norco, NSAIDs, and Cam walker.  Follow-up with orthopedics.  PMP checked, patient without chronic prescriptions.  At this time, patient appears safe for discharge.  Return precautions given.  Patient states she understands and agrees to plan.   Final Clinical Impressions(s) / ED Diagnoses   Final diagnoses:  Closed fracture of distal end of  right fibula, unspecified fracture morphology, initial encounter    ED Discharge Orders        Ordered    ibuprofen (ADVIL,MOTRIN) 800 MG tablet  3 times daily with meals     04/14/17 2306    HYDROcodone-acetaminophen (NORCO/VICODIN) 5-325 MG tablet  Every 6 hours PRN     04/14/17 2306       Franchot Heidelberg, PA-C 04/14/17 2342    Fatima Blank, MD 04/15/17 1312

## 2017-04-14 NOTE — ED Triage Notes (Signed)
The pt is unable to bear weight without pain

## 2017-04-14 NOTE — ED Triage Notes (Signed)
The pt was walking  And fell off steps at home she felt a "pop"  Rt lateral ankle pain with swelling approx 30 minutes ago.

## 2017-04-14 NOTE — Discharge Instructions (Signed)
Take ibuprofen 3 times a day with meals.  Do not take other anti-inflammatories at the same time open (Advil, Motrin, naproxen, Aleve).  Take Norco as needed for severe pain. Use ice, 20 minutes at a time, 3 times a day for pain and swelling. Use the Cam walker whenever weightbearing unless in the shower. Follow-up with the orthopedic doctor for further evaluation and management of your ankle. Return to the emergency room if you develop numbness of your foot, your foot turns white, pain worsens significantly, or with any new or concerning symptoms.

## 2017-04-14 NOTE — ED Notes (Signed)
Pt had fall. Swelling noticed on right ankle. Pt complain of pain on right foot. Cap refill less than two seconds on right toes. Distal extremities warm and pink.

## 2017-04-15 NOTE — ED Notes (Signed)
Signature pad not working, pt and family member verbalized understanding of discharge instructions including discharge, follow-up and prescription/pain control instructions.

## 2017-07-27 ENCOUNTER — Encounter (HOSPITAL_COMMUNITY): Payer: Self-pay | Admitting: Emergency Medicine

## 2017-07-27 ENCOUNTER — Ambulatory Visit (HOSPITAL_COMMUNITY)
Admission: EM | Admit: 2017-07-27 | Discharge: 2017-07-27 | Disposition: A | Discharge status: Home / Self Care | Payer: Managed Care, Other (non HMO) | Attending: Internal Medicine | Admitting: Internal Medicine

## 2017-07-27 DIAGNOSIS — F17210 Nicotine dependence, cigarettes, uncomplicated: Secondary | ICD-10-CM | POA: Diagnosis not present

## 2017-07-27 DIAGNOSIS — J029 Acute pharyngitis, unspecified: Secondary | ICD-10-CM | POA: Diagnosis not present

## 2017-07-27 DIAGNOSIS — E010 Iodine-deficiency related diffuse (endemic) goiter: Secondary | ICD-10-CM

## 2017-07-27 DIAGNOSIS — E049 Nontoxic goiter, unspecified: Secondary | ICD-10-CM | POA: Diagnosis not present

## 2017-07-27 DIAGNOSIS — M791 Myalgia, unspecified site: Secondary | ICD-10-CM

## 2017-07-27 DIAGNOSIS — J039 Acute tonsillitis, unspecified: Secondary | ICD-10-CM | POA: Diagnosis present

## 2017-07-27 DIAGNOSIS — Z79899 Other long term (current) drug therapy: Secondary | ICD-10-CM | POA: Insufficient documentation

## 2017-07-27 DIAGNOSIS — H9203 Otalgia, bilateral: Secondary | ICD-10-CM | POA: Diagnosis present

## 2017-07-27 DIAGNOSIS — R509 Fever, unspecified: Secondary | ICD-10-CM | POA: Diagnosis not present

## 2017-07-27 DIAGNOSIS — H65192 Other acute nonsuppurative otitis media, left ear: Secondary | ICD-10-CM | POA: Diagnosis not present

## 2017-07-27 DIAGNOSIS — F419 Anxiety disorder, unspecified: Secondary | ICD-10-CM | POA: Diagnosis not present

## 2017-07-27 LAB — POCT RAPID STREP A: Streptococcus, Group A Screen (Direct): NEGATIVE

## 2017-07-27 LAB — POCT INFECTIOUS MONO SCREEN: Mono Screen: NEGATIVE

## 2017-07-27 MED ORDER — ACETAMINOPHEN 325 MG PO TABS
ORAL_TABLET | ORAL | Status: AC
  Filled 2017-07-27: qty 2

## 2017-07-27 MED ORDER — AMOXICILLIN 500 MG PO TABS: 500.0000 mg | ORAL_TABLET | Freq: Two times a day (BID) | ORAL | 0 refills | Status: AC

## 2017-07-27 MED ORDER — CETIRIZINE-PSEUDOEPHEDRINE ER 5-120 MG PO TB12: 1.0000 | ORAL_TABLET | Freq: Every day | ORAL | 0 refills | Status: DC

## 2017-07-27 MED ORDER — ACETAMINOPHEN 325 MG PO TABS
650.0000 mg | ORAL_TABLET | Freq: Once | ORAL | Status: AC
  Administered 2017-07-27: 650 mg via ORAL

## 2017-07-27 NOTE — Discharge Instructions (Addendum)
Strep test negative, will send out for culture and we will call you with results Mono negative Get plenty of rest and push fluids Amoxicillin prescribed.  Take as directed and to completion Zyrtec D prescribed.  Take as needed for symptomatic relief Continue OTC ibuprofen and/or tylenol as needed for symptomatic relief of body aches, pain, and fever Please make a follow up appointment with your PCP for incidental finding of enlarged thyroid on examination and/or if symptoms persists Return or go to ER if patient has any new or worsening symptoms

## 2017-07-27 NOTE — ED Provider Notes (Signed)
[facility]   [id number] 07/27/17 Arrival Time: 1509  SUBJECTIVE: History from: patient.  Nancy Santiago is a 52 y.o. female who presents with abrupt onset of sore throat, and bilateral ear pain for the past week.  Denies positive sick exposure or precipitating event.  Has tried Copywriter, advertising and tylenol without relief.  Symptoms are made worse with lying down.  Denies previous symptoms in the past.   Complains of fever or 101.1 in office, chills, fatigue, sinus pain/pressure, rhinorrhea, and increased urination.   Denies cough, SOB, wheezing, chest pain, nausea, rash, changes in bowel or bladder habits.    ROS: As per HPI.  Past Medical History:  Diagnosis Date  . Anxiety   . Anxiety   . Depression    Past Surgical History:  Procedure Laterality Date  . CESAREAN SECTION    . HERNIA REPAIR     No Known Allergies No current facility-administered medications on file prior to encounter.    Current Outpatient Medications on File Prior to Encounter  Medication Sig Dispense Refill  . cyclobenzaprine (FLEXERIL) 10 MG tablet Take 1 tablet (10 mg total) by mouth 3 (three) times daily as needed for muscle spasms. 20 tablet 0  . HYDROcodone-acetaminophen (NORCO/VICODIN) 5-325 MG tablet Take 2 tablets by mouth every 6 (six) hours as needed for severe pain. 8 tablet 0  . ibuprofen (ADVIL,MOTRIN) 800 MG tablet Take 1 tablet (800 mg total) by mouth 3 (three) times daily with meals. 30 tablet 0  . predniSONE (DELTASONE) 50 MG tablet Take 1 pill daily for 5 days. 5 tablet 0  . trimethoprim-polymyxin b (POLYTRIM) ophthalmic solution Place 1 drop into the left eye every 4 (four) hours. 10 mL 0  . UNKNOWN TO PATIENT     . valACYclovir (VALTREX) 1000 MG tablet Take 1,000 mg by mouth 2 (two) times daily.     Social History   Socioeconomic History  . Marital status: Legally Separated    Spouse name: Not on file  . Number of children: Not on file  . Years of education: Not on file    . Highest education level: Not on file  Occupational History  . Not on file  Social Needs  . Financial resource strain: Not on file  . Food insecurity:    Worry: Not on file    Inability: Not on file  . Transportation needs:    Medical: Not on file    Non-medical: Not on file  Tobacco Use  . Smoking status: Current Every Day Smoker    Packs/day: 0.50    Types: Cigarettes  . Smokeless tobacco: Never Used  Substance and Sexual Activity  . Alcohol use: Yes    Comment: occasional  . Drug use: No  . Sexual activity: Not on file  Lifestyle  . Physical activity:    Days per week: Not on file    Minutes per session: Not on file  . Stress: Not on file  Relationships  . Social connections:    Talks on phone: Not on file    Gets together: Not on file    Attends religious service: Not on file    Active member of club or organization: Not on file    Attends meetings of clubs or organizations: Not on file    Relationship status: Not on file  . Intimate partner violence:    Fear of current or ex partner: Not on file    Emotionally abused: Not on file    Physically  abused: Not on file    Forced sexual activity: Not on file  Other Topics Concern  . Not on file  Social History Narrative  . Not on file   History reviewed. No pertinent family history.  OBJECTIVE:  Vitals:   07/27/17 1553 07/27/17 1724  BP: 104/71 103/71  Pulse: (!) 112 96  Resp: 18 16  Temp: (!) 101.1 F (38.4 C) 99.5 F (37.5 C)  TempSrc: Oral Oral  SpO2: 97% 97%     General appearance: alert; appears fatigued HEENT: Ears: RT EAC clear, RT TM pearly gray with visible cone of light, without erythema; LT EAC clear, LT TM mildly erythematous; nontender with tragal or auricle manipulation; Eyes: PERRL, EOMI grossly; Sinuses nontender to palpation; Nose: clear rhinorrhea; Throat: oropharynx mildly erythematous, tonsils 2+ with white tonsillar exudates, uvula midline Neck: supple without LAD; incidental  thyromegaly Lungs: CTA bilaterally without adventitious breath sounds Heart: regular rate and rhythm.  Radial pulses 2+ symmetrical bilaterally Skin: warm and dry Psychological: alert and cooperative; normal mood and affect  Labs: Results for orders placed or performed during the hospital encounter of 07/27/17 (from the past 24 hour(s))  POCT rapid strep A [facility])     Status: None   Collection Time: 07/27/17  4:06 PM  Result Value Ref Range   Streptococcus, Group A Screen (Direct) NEGATIVE NEGATIVE  Infectious mono screen, POC     Status: None   Collection Time: 07/27/17  4:54 PM  Result Value Ref Range   Mono Screen NEGATIVE NEGATIVE     ASSESSMENT & PLAN:  1. Other non-recurrent acute nonsuppurative otitis media of left ear   2. Tonsillitis   3. Thyromegaly     Meds ordered this encounter  Medications  . acetaminophen (TYLENOL) tablet 650 mg  . amoxicillin (AMOXIL) 500 MG tablet    Sig: Take 1 tablet (500 mg total) by mouth 2 (two) times daily for 7 days.    Dispense:  14 tablet    Refill:  0    Order Specific Question:   Supervising Provider    Answer:   Wynona Luna [phone]  . cetirizine-pseudoephedrine (ZYRTEC-D) 5-120 MG tablet    Sig: Take 1 tablet by mouth daily.    Dispense:  20 tablet    Refill:  0    Order Specific Question:   Supervising Provider    Answer:   Wynona Luna [[id number]]    Strep test negative, will send out for culture and we will call you with results Mono negative Get plenty of rest and push fluids Amoxicillin prescribed.  Take as directed and to completion Zyrtec D prescribed.  Take as needed for symptomatic relief Continue OTC ibuprofen and/or tylenol as needed for symptomatic relief of body aches, pain, and fever Please make a follow up appointment with your PCP for incidental finding of enlarged thyroid on examination and/or if symptoms persists Return or go to ER if patient has any new or worsening symptoms    Reviewed expectations re: course of current medical issues. Questions answered. Outlined signs and symptoms indicating need for more acute intervention. Patient verbalized understanding. After Visit Summary given.        Lestine Box, PA-C 07/27/17 1747

## 2017-07-27 NOTE — ED Triage Notes (Signed)
Pt here for fever and sore throat with body aches

## 2017-07-30 LAB — CULTURE, GROUP A STREP (THRC)

## 2017-10-08 ENCOUNTER — Encounter (HOSPITAL_COMMUNITY): Payer: Self-pay | Admitting: Emergency Medicine

## 2017-10-08 ENCOUNTER — Ambulatory Visit (HOSPITAL_COMMUNITY)
Admission: EM | Admit: 2017-10-08 | Discharge: 2017-10-08 | Disposition: A | Discharge status: Home / Self Care | Payer: Managed Care, Other (non HMO) | Attending: Family Medicine | Admitting: Family Medicine

## 2017-10-08 DIAGNOSIS — H66002 Acute suppurative otitis media without spontaneous rupture of ear drum, left ear: Secondary | ICD-10-CM | POA: Diagnosis not present

## 2017-10-08 MED ORDER — AMOXICILLIN-POT CLAVULANATE 875-125 MG PO TABS: 1.0000 | ORAL_TABLET | Freq: Two times a day (BID) | ORAL | 0 refills | Status: AC

## 2017-10-08 MED ORDER — IBUPROFEN 600 MG PO TABS: 600.0000 mg | ORAL_TABLET | Freq: Four times a day (QID) | ORAL | 0 refills | Status: DC | PRN

## 2017-10-08 NOTE — Discharge Instructions (Signed)
Begin Augmentin twice daily for 10 days for ear infection/sinus infection May try daily allergy pill for congestion or continue Mucinex  Use anti-inflammatories for pain/swelling. You may take up to 800 mg Ibuprofen every 8 hours with food. You may supplement Ibuprofen with Tylenol [phone] every 8 hours.   Follow up if symptoms persisting, worsening, fever, shortness of breath, chest pain

## 2017-10-08 NOTE — ED Triage Notes (Signed)
PT reports cough, congestion, ear pain. Ear pain is severe on left side

## 2017-10-10 NOTE — ED Provider Notes (Signed)
[facility]    CSN: [id number] Arrival date & time: 10/08/17  1901     History   Chief Complaint Chief Complaint  Patient presents with  . URI    HPI Nancy Santiago is a 53 y.o. female history of anxiety presenting today for evaluation cough, congestion and ear pain.  Patient's main complaint is her left ear.  She has had cough and congestion that has been present for approximately 1 week.  Since she has developed severe pain on her left side.  Denies drainage.  Denies fevers, nausea, vomiting, abdominal pain.  Denies shortness of breath and chest pain.  She has been using over-the-counter medicines without relief.  HPI  Past Medical History:  Diagnosis Date  . Anxiety   . Anxiety   . Depression     There are no active problems to display for this patient.   Past Surgical History:  Procedure Laterality Date  . CESAREAN SECTION    . HERNIA REPAIR      OB History   None      Home Medications    Prior to Admission medications   Medication Sig Start Date End Date Taking? Authorizing Provider  amoxicillin-clavulanate (AUGMENTIN) 875-125 MG tablet Take 1 tablet by mouth every 12 (twelve) hours for 10 days. 10/08/17 10/18/17  Dorothe Elmore C, PA-C  ibuprofen (ADVIL,MOTRIN) 600 MG tablet Take 1 tablet (600 mg total) by mouth every 6 (six) hours as needed. 10/08/17   Allesha Aronoff, Elesa Hacker, PA-C  UNKNOWN TO PATIENT     [provider]    Family History No family history on file.  Social History Social History   Tobacco Use  . Smoking status: Current Every Day Smoker    Packs/day: 0.50    Types: Cigarettes  . Smokeless tobacco: Never Used  Substance Use Topics  . Alcohol use: Yes    Comment: occasional  . Drug use: No     Allergies   Patient has no known allergies.   Review of Systems Review of Systems  Constitutional: Negative for activity change, appetite change, chills, fatigue and fever.  HENT: Positive for congestion, ear  pain and rhinorrhea. Negative for ear discharge, sinus pressure, sore throat and trouble swallowing.   Eyes: Negative for discharge and redness.  Respiratory: Positive for cough. Negative for chest tightness and shortness of breath.   Cardiovascular: Negative for chest pain.  Gastrointestinal: Negative for abdominal pain, diarrhea, nausea and vomiting.  Musculoskeletal: Negative for myalgias.  Skin: Negative for rash.  Neurological: Negative for dizziness, light-headedness and headaches.     Physical Exam Triage Vital Signs ED Triage Vitals [10/08/17 1956]  Enc Vitals Group     BP 121/75     Pulse Rate 96     Resp 16     Temp 97.8 F (36.6 C)     Temp Source Temporal     SpO2 100 %     Weight 130 lb (59 kg)     Height      Head Circumference      Peak Flow      Pain Score 8     Pain Loc      Pain Edu?      Excl. in [place]?    No data found.  Updated Vital Signs BP 121/75   Pulse 96   Temp 97.8 F (36.6 C) (Temporal)   Resp 16   Wt 130 lb (59 kg)   SpO2 100%   BMI 22.31 kg/m  Visual Acuity Right Eye Distance:   Left Eye Distance:   Bilateral Distance:    Right Eye Near:   Left Eye Near:    Bilateral Near:     Physical Exam  Constitutional: She appears well-developed and well-nourished. No distress.  HENT:  Head: Normocephalic and atraumatic.  Left TM erythematous dull and bulging.  Canal clear without swelling or erythema. Right TM pearly gray, good bony landmarks  Oral mucosa pink and moist, no tonsillar enlargement or exudate. Posterior pharynx patent and nonerythematous, no uvula deviation or swelling. Normal phonation.   Eyes: Conjunctivae are normal.  Neck: Neck supple.  Cardiovascular: Normal rate and regular rhythm.  No murmur heard. Pulmonary/Chest: Effort normal and breath sounds normal. No respiratory distress.  Breathing comfortably at rest, CTABL, no wheezing, rales or other adventitious sounds auscultated  Abdominal: Soft. There is no  tenderness.  Musculoskeletal: She exhibits no edema.  Neurological: She is alert.  Skin: Skin is warm and dry.  Psychiatric: She has a normal mood and affect.  Nursing note and vitals reviewed.    UC Treatments / Results  Labs (all labs ordered are listed, but only abnormal results are displayed) Labs Reviewed - No data to display  EKG None  Radiology No results found.  Procedures Procedures (including critical care time)  Medications Ordered in UC Medications - No data to display  Initial Impression / Assessment and Plan / UC Course  I have reviewed the triage vital signs and the nursing notes.  Pertinent labs & imaging results that were available during my care of the patient were reviewed by me and considered in my medical decision making (see chart for details).     Patient was otitis media, will treat with Augmentin, add in daily allergy pill to help with congestion and drainage.  May also continue to use decongestants.  Tylenol and ibuprofen to help with pain.Discussed strict return precautions. Patient verbalized understanding and is agreeable with plan.  Final Clinical Impressions(s) / UC Diagnoses   Final diagnoses:  Non-recurrent acute suppurative otitis media of left ear without spontaneous rupture of tympanic membrane     Discharge Instructions     Begin Augmentin twice daily for 10 days for ear infection/sinus infection May try daily allergy pill for congestion or continue Mucinex  Use anti-inflammatories for pain/swelling. You may take up to 800 mg Ibuprofen every 8 hours with food. You may supplement Ibuprofen with Tylenol [phone] mg every 8 hours.   Follow up if symptoms persisting, worsening, fever, shortness of breath, chest pain    ED Prescriptions    Medication Sig Dispense Auth. Provider   amoxicillin-clavulanate (AUGMENTIN) 875-125 MG tablet Take 1 tablet by mouth every 12 (twelve) hours for 10 days. 20 tablet Leota Maka C, PA-C    ibuprofen (ADVIL,MOTRIN) 600 MG tablet Take 1 tablet (600 mg total) by mouth every 6 (six) hours as needed. 30 tablet Oluwafemi Villella, [place] C, PA-C     Controlled Substance Prescriptions [place] Controlled Substance Registry consulted? Not Applicable   Janith Lima, [place] 10/10/17 1015

## 2017-12-01 ENCOUNTER — Ambulatory Visit (HOSPITAL_COMMUNITY)
Admission: EM | Admit: 2017-12-01 | Discharge: 2017-12-01 | Disposition: A | Discharge status: Home / Self Care | Payer: Managed Care, Other (non HMO) | Attending: Family Medicine | Admitting: Family Medicine

## 2017-12-01 ENCOUNTER — Encounter (HOSPITAL_COMMUNITY): Payer: Self-pay

## 2017-12-01 DIAGNOSIS — H6502 Acute serous otitis media, left ear: Secondary | ICD-10-CM | POA: Diagnosis not present

## 2017-12-01 MED ORDER — FLUTICASONE PROPIONATE 50 MCG/ACT NA SUSP: 1.0000 | Freq: Every day | NASAL | 2 refills | Status: DC

## 2017-12-01 MED ORDER — AMOXICILLIN-POT CLAVULANATE ER 1000-62.5 MG PO TB12: 1.0000 | ORAL_TABLET | Freq: Two times a day (BID) | ORAL | 0 refills | Status: DC

## 2017-12-01 NOTE — Discharge Instructions (Addendum)
Start flonase nightly to help fluid drain behind ear Ibuprofen TID as needed with food for pain Sending in high dose augmentin twice a day for 10 days F/u with your PCP

## 2017-12-01 NOTE — ED Provider Notes (Signed)
Patient: Nancy Santiago MRN: [id number] DOB: 02-15-1965 PCP: Lucianne Lei, MD     Subjective:  Chief Complaint  Patient presents with  . Otalgia  . Neck Pain    HPI: The patient is a 52 y.o. female who presents today for ear pain/neck pain. She was seen in September here at [facility] and had otitis media and given course of Augmentin. She states her ear never really got back to normal after this. She states about 3Am this morning she woke up with horrible left ear pain. She denies any tinnitus, but may have some hearing loss. No discharge from this ear. No fever/chills. No pain with swallowing, but has some radiation from the ear down her neck. She has taken excedrin this AM.   Review of Systems  Constitutional: Negative for chills and fever.  HENT: Positive for congestion, ear pain and hearing loss. Negative for dental problem, ear discharge, rhinorrhea, sinus pressure, sinus pain, sore throat and trouble swallowing.   Eyes: Negative for discharge and itching.  Respiratory: Negative for cough and shortness of breath.   Gastrointestinal: Negative for abdominal pain.  Musculoskeletal: Positive for neck pain (from left ear ).    Allergies Patient has No Known Allergies.  Past Medical History Patient  has a past medical history of Anxiety, Anxiety, and Depression.  Surgical History Patient  has a past surgical history that includes Hernia repair and Cesarean section.  Family History Pateint's family history is not on file.  Social History Patient  reports that she has been smoking cigarettes. She has been smoking about 0.50 packs per day. She has never used smokeless tobacco. She reports that she drinks alcohol. She reports that she does not use drugs.    Objective: Vitals:   12/01/17 1132  BP: 125/76  Pulse: 95  Resp: 20  Temp: 98.1 F (36.7 C)  TempSrc: Oral  SpO2: 98%    There is no height or weight on file to calculate BMI.  Physical Exam  Constitutional: She  appears well-developed and well-nourished.  HENT:  Right Ear: External ear normal.  Left Ear: External ear normal.  Mouth/Throat: Oropharynx is clear and moist.  Right TM wnl Left TM bulging, erythematous   Neck: Normal range of motion. Neck supple.  Cardiovascular: Normal rate, regular rhythm and normal heart sounds.  Pulmonary/Chest: Effort normal and breath sounds normal.  Abdominal: Soft. Bowel sounds are normal.  Lymphadenopathy:    She has no cervical adenopathy.  Vitals reviewed.      Assessment/plan:   ICD-10-CM   1. Non-recurrent acute serous otitis media of left ear H65.02   -doing high dose augmentin for her. flonase daily and ibuprofen TID as needed for pain. F/u with PCP this week to make sure getting better and no ENT referral warranted.     Orma Flaming, MD  12/01/2017    Orma Flaming, MD 12/01/17 [phone]

## 2017-12-01 NOTE — ED Triage Notes (Signed)
Pt presents with pain in left ear and neck.

## 2019-07-15 ENCOUNTER — Other Ambulatory Visit: Payer: Self-pay | Admitting: Family Medicine

## 2019-07-15 DIAGNOSIS — Z1231 Encounter for screening mammogram for malignant neoplasm of breast: Secondary | ICD-10-CM

## 2019-07-24 ENCOUNTER — Ambulatory Visit
Admission: RE | Admit: 2019-07-24 | Discharge: 2019-07-24 | Disposition: A | Discharge status: Home / Self Care | Payer: Managed Care, Other (non HMO) | Source: Ambulatory Visit | Attending: Family Medicine | Admitting: Family Medicine

## 2019-07-24 ENCOUNTER — Other Ambulatory Visit: Payer: Self-pay

## 2019-07-24 DIAGNOSIS — Z1231 Encounter for screening mammogram for malignant neoplasm of breast: Secondary | ICD-10-CM

## 2019-09-01 ENCOUNTER — Emergency Department (HOSPITAL_COMMUNITY)
Admission: EM | Admit: 2019-09-01 | Discharge: 2019-09-02 | Disposition: A | Discharge status: Home / Self Care | Payer: Managed Care, Other (non HMO) | Attending: Emergency Medicine | Admitting: Emergency Medicine

## 2019-09-01 ENCOUNTER — Encounter (HOSPITAL_COMMUNITY): Payer: Self-pay | Admitting: Emergency Medicine

## 2019-09-01 ENCOUNTER — Other Ambulatory Visit: Payer: Self-pay

## 2019-09-01 DIAGNOSIS — Z5321 Procedure and treatment not carried out due to patient leaving prior to being seen by health care provider: Secondary | ICD-10-CM | POA: Insufficient documentation

## 2019-09-01 DIAGNOSIS — H9203 Otalgia, bilateral: Secondary | ICD-10-CM | POA: Insufficient documentation

## 2019-09-01 NOTE — ED Notes (Signed)
Pt was encouraged to stay and told the risk .Pt said the wait was to long.

## 2019-09-01 NOTE — ED Triage Notes (Signed)
Pt. Stated, I started having ear drainage on Friday and then the pain to both ears. This makes me have a headache.

## 2020-07-06 ENCOUNTER — Other Ambulatory Visit: Payer: Self-pay | Admitting: Family Medicine

## 2020-07-06 DIAGNOSIS — Z1231 Encounter for screening mammogram for malignant neoplasm of breast: Secondary | ICD-10-CM

## 2020-07-14 ENCOUNTER — Other Ambulatory Visit: Payer: Self-pay | Admitting: Physician Assistant

## 2020-07-14 DIAGNOSIS — E042 Nontoxic multinodular goiter: Secondary | ICD-10-CM

## 2020-07-27 ENCOUNTER — Other Ambulatory Visit: Payer: Self-pay

## 2020-07-27 ENCOUNTER — Ambulatory Visit
Admission: RE | Admit: 2020-07-27 | Discharge: 2020-07-27 | Disposition: A | Discharge status: Home / Self Care | Payer: Managed Care, Other (non HMO) | Source: Ambulatory Visit | Attending: Physician Assistant | Admitting: Physician Assistant

## 2020-07-27 DIAGNOSIS — E042 Nontoxic multinodular goiter: Secondary | ICD-10-CM

## 2020-08-04 ENCOUNTER — Other Ambulatory Visit: Payer: Self-pay | Admitting: Physician Assistant

## 2020-08-04 ENCOUNTER — Other Ambulatory Visit: Payer: Self-pay | Admitting: Pulmonary Disease

## 2020-08-05 ENCOUNTER — Other Ambulatory Visit: Payer: Self-pay | Admitting: Physician Assistant

## 2020-08-05 DIAGNOSIS — E041 Nontoxic single thyroid nodule: Secondary | ICD-10-CM

## 2020-08-25 ENCOUNTER — Other Ambulatory Visit (HOSPITAL_COMMUNITY)
Admission: RE | Admit: 2020-08-25 | Discharge: 2020-08-25 | Disposition: A | Discharge status: Home / Self Care | Payer: Managed Care, Other (non HMO) | Source: Ambulatory Visit | Attending: Physician Assistant | Admitting: Physician Assistant

## 2020-08-25 ENCOUNTER — Ambulatory Visit
Admission: RE | Admit: 2020-08-25 | Discharge: 2020-08-25 | Disposition: A | Discharge status: Home / Self Care | Payer: Managed Care, Other (non HMO) | Source: Ambulatory Visit | Attending: Physician Assistant | Admitting: Physician Assistant

## 2020-08-25 ENCOUNTER — Other Ambulatory Visit: Payer: Self-pay

## 2020-08-25 DIAGNOSIS — D440 Neoplasm of uncertain behavior of thyroid gland: Secondary | ICD-10-CM | POA: Diagnosis not present

## 2020-08-25 DIAGNOSIS — E041 Nontoxic single thyroid nodule: Secondary | ICD-10-CM

## 2020-08-25 DIAGNOSIS — E042 Nontoxic multinodular goiter: Secondary | ICD-10-CM | POA: Diagnosis present

## 2020-08-26 LAB — CYTOLOGY - NON PAP

## 2020-08-30 ENCOUNTER — Ambulatory Visit
Admission: RE | Admit: 2020-08-30 | Discharge: 2020-08-30 | Disposition: A | Discharge status: Home / Self Care | Payer: Managed Care, Other (non HMO) | Source: Ambulatory Visit | Attending: Family Medicine | Admitting: Family Medicine

## 2020-08-30 ENCOUNTER — Other Ambulatory Visit: Payer: Self-pay

## 2020-08-30 DIAGNOSIS — Z1231 Encounter for screening mammogram for malignant neoplasm of breast: Secondary | ICD-10-CM

## 2020-09-16 ENCOUNTER — Encounter (HOSPITAL_COMMUNITY): Payer: Self-pay

## 2023-01-25 IMAGING — MG MM DIGITAL SCREENING BILAT W/ TOMO AND CAD
8 series · 8 of 24 positions shown · non-contrast
Comparison: Previous exam(s).

CLINICAL DATA: Screening.

EXAM:
DIGITAL SCREENING BILATERAL MAMMOGRAM WITH TOMOSYNTHESIS AND CAD
TECHNIQUE: Bilateral screening digital craniocaudal and mediolateral oblique
mammograms were obtained. Bilateral screening digital breast
tomosynthesis was performed. The images were evaluated with
computer-aided detection.

[R CC synth-2D]
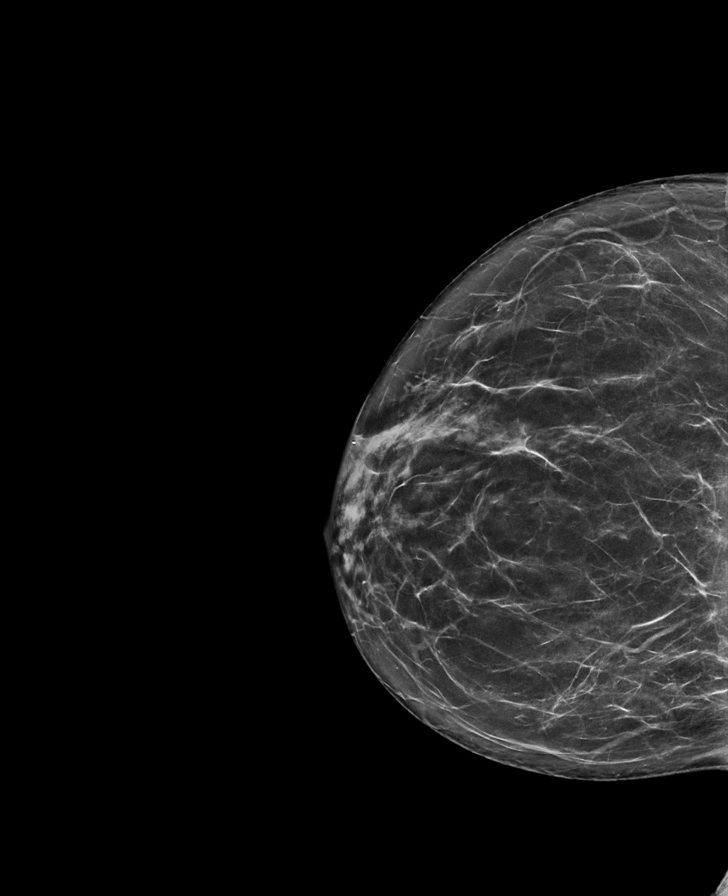

[L MLO synth-2D]
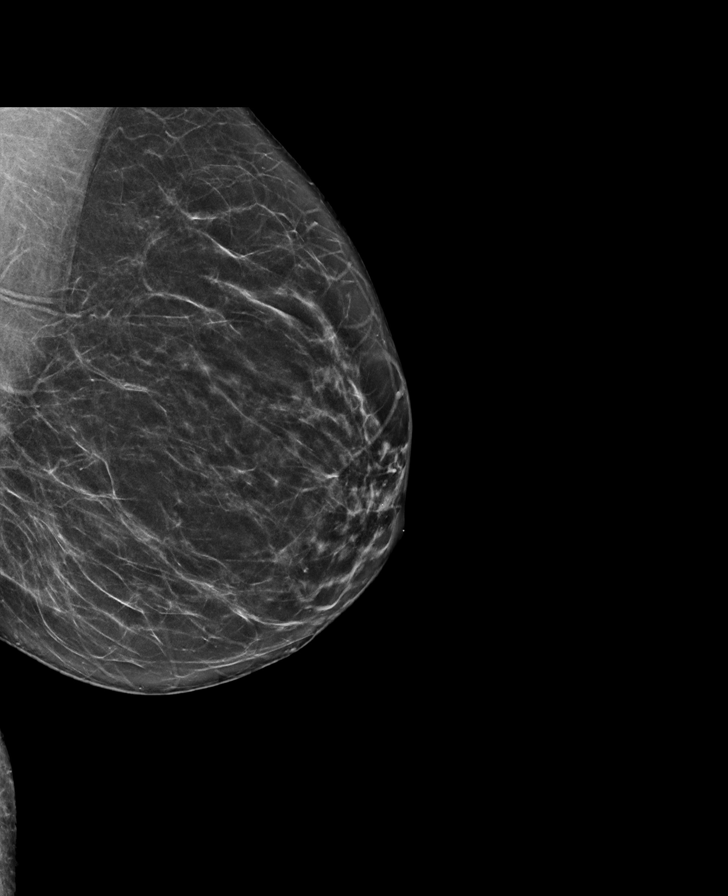

[L CC synth-2D]
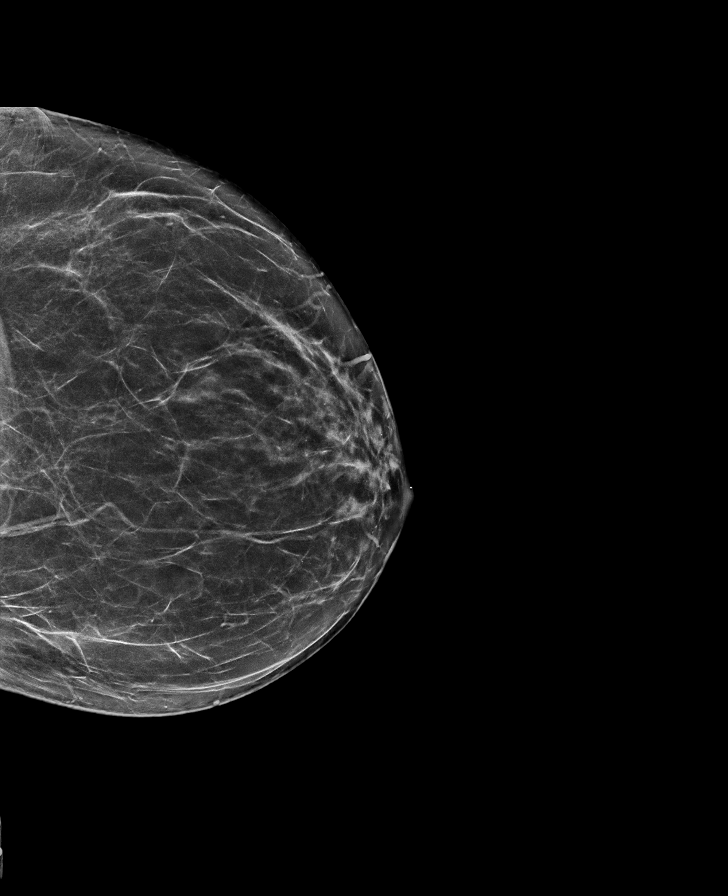

[R MLO synth-2D]
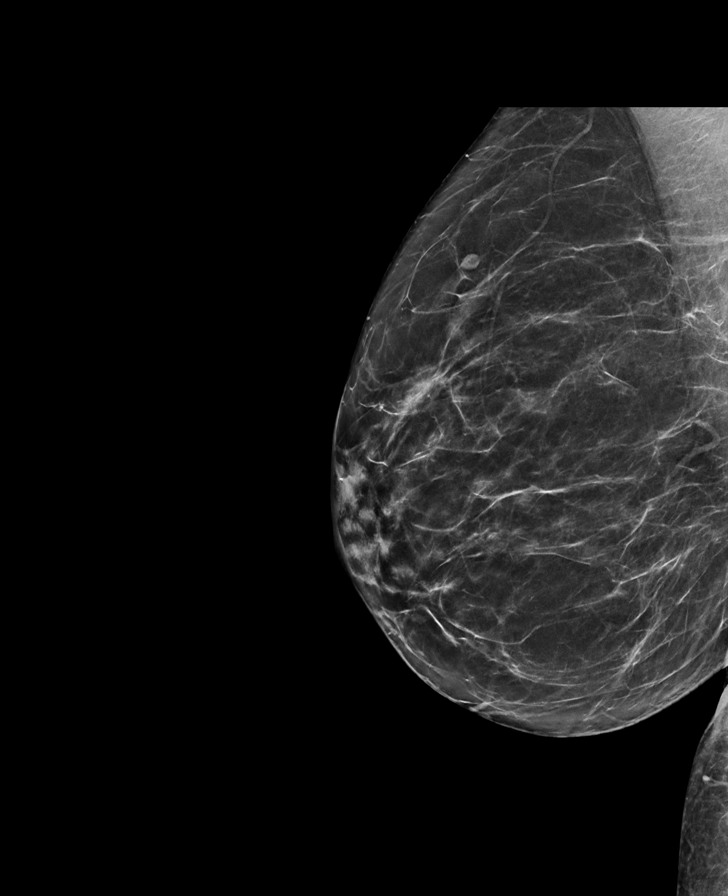

[L CC tomo · tomo slice 35/68.0]
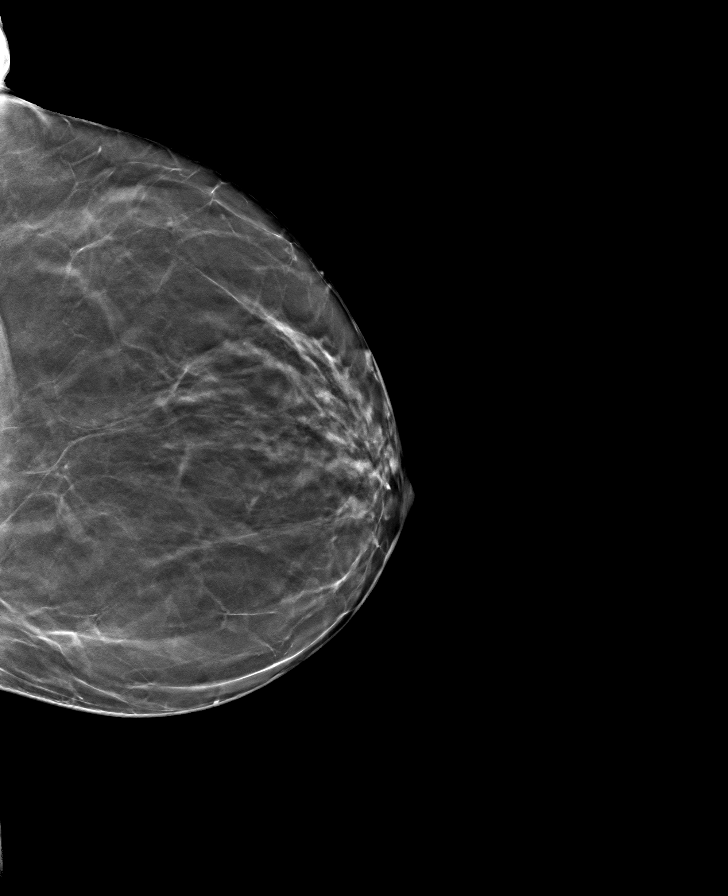

[R CC tomo · tomo slice 35/68.0]
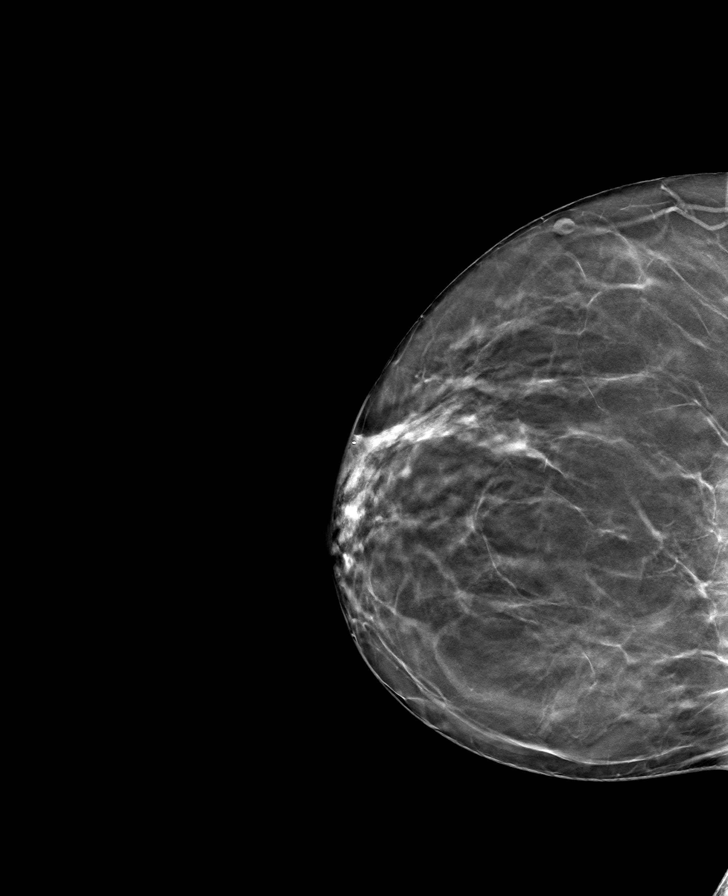

[L MLO tomo · tomo slice 33/66.0]
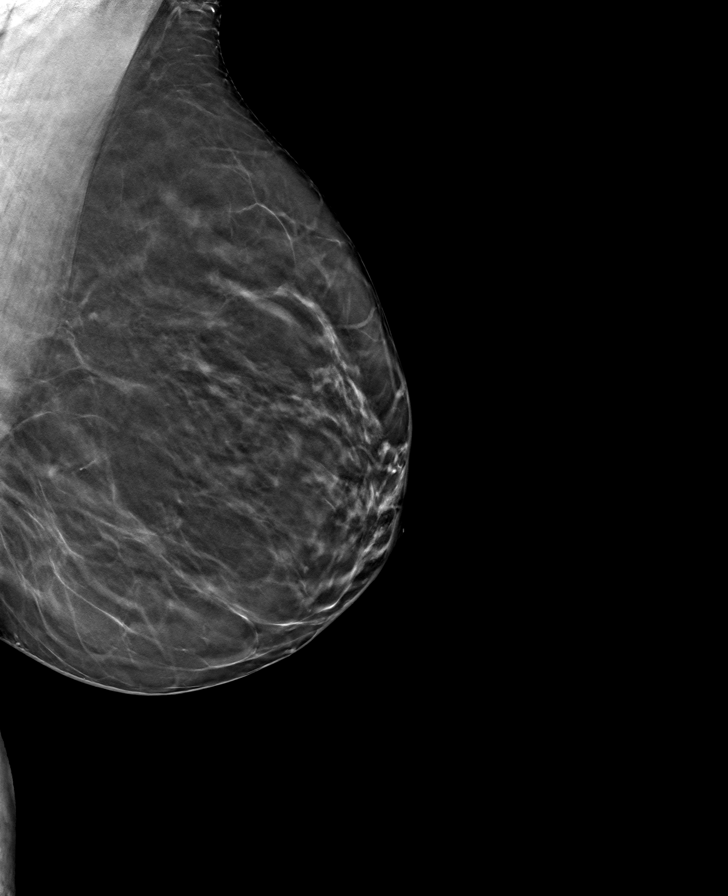

[R MLO tomo · tomo slice 33/66.0]
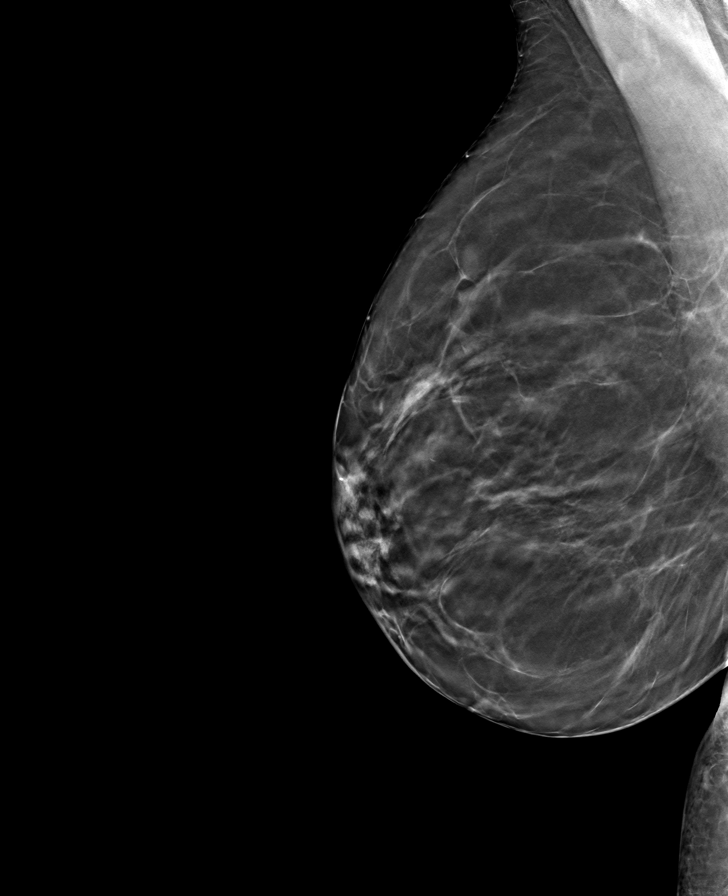

[8 of 24 positions shown; findings below may reference images not displayed]

ACR Breast Density Category b: There are scattered areas of
fibroglandular density.
FINDINGS: There are no findings suspicious for malignancy.
IMPRESSION: No mammographic evidence of malignancy. A result letter of this
screening mammogram will be mailed directly to the patient.

RECOMMENDATION:
Screening mammogram in one year. (Code:51-O-LD2)

BI-RADS CATEGORY  1: Negative.

## 2023-12-23 ENCOUNTER — Emergency Department (HOSPITAL_COMMUNITY)
Admission: EM | Admit: 2023-12-23 | Discharge: 2023-12-23 | Disposition: A | Discharge status: Home / Self Care | Attending: Emergency Medicine | Admitting: Emergency Medicine

## 2023-12-23 ENCOUNTER — Other Ambulatory Visit: Payer: Self-pay

## 2023-12-23 ENCOUNTER — Encounter (HOSPITAL_COMMUNITY): Payer: Self-pay

## 2023-12-23 DIAGNOSIS — F10920 Alcohol use, unspecified with intoxication, uncomplicated: Secondary | ICD-10-CM

## 2023-12-23 NOTE — ED Provider Notes (Signed)
 [facility]PT [facility] Emergency Department Provider Note MRN:  [id number]  Arrival date & time: 12/23/2023     Chief Complaint   Alcohol Intoxication   History of Present Illness   Nancy Santiago is a 58 y.o. year-old female presents to the ED with chief complaint of feeling like she was drugged.  She states that she had been drinking alcohol today  and she also took a dose of Tylenol  sinus.  She states that this made her feel more drugged than normal with just the alcohol alone.  She states that she has been drinking more alcohol than normal lately.  She was reportedly confused at home, but is not confused now.  She denies nausea, vomiting, diarrhea, abdominal pain.  Denies any other associated symptoms.  She states that she would like to be discharged without workup.SABRA  History provided by patient.   Review of Systems  Pertinent positive and negative review of systems noted in HPI.    Physical Exam   Vitals:   12/23/23 2256  BP: 124/81  Pulse: 89  Resp: 17  Temp: 98.7 F (37.1 C)  SpO2: 100%    CONSTITUTIONAL:  non toxic-appearing, NAD NEURO:  Alert and oriented x 3, CN 3-12 grossly intact EYES:  eyes equal and reactive ENT/NECK:  Supple, no stridor  CARDIO:  normal rate, regular rhythm, appears well-perfused  PULM:  No respiratory distress, CTAB GI/GU:  non-distended, no focal abdominal pain MSK/SPINE:  No gross deformities, no edema, moves all extremities  SKIN:  no rash, atraumatic   *Additional and/or pertinent findings included in MDM below  Diagnostic and Interventional Summary    EKG Interpretation Date/Time:    Ventricular Rate:    PR Interval:    QRS Duration:    QT Interval:    QTC Calculation:   R Axis:      Text Interpretation:         Labs Reviewed - No data to display  No orders to display    Medications - No data to display   Procedures  /  Critical Care Procedures  ED Course and Medical Decision Making  I  have reviewed the triage vital signs, the nursing notes, and pertinent available records from the EMR.  Social Determinants Affecting Complexity of Care: Patient has no clinically significant social determinants affecting this chief complaint..   ED Course:    Medical Decision Making Patient here because she was acting confused earlier.  She states that she did not want to come to the hospital initially.  She also states that she does not want to be here now.  She had been drinking alcohol and also took a dose of Tylenol  cold and sinus.  She responds to all my questions normally.  She states that she would rather go home and then had blood work done.  She does appear to have capacity to make her own medical decisions.  She does not appear to have an emergent medical decision requiring workup, but I did offer to run labs.  She declined this and asked for discharge.         Consultants: No consultations were needed in caring for this patient.   Treatment and Plan: Patient declines workup.  Requests discharge.  She does appear to have capacity.  Will discharge per her request.    Final Clinical Impressions(s) / ED Diagnoses     ICD-10-CM   1. Alcoholic intoxication without complication  F10.920       ED  Discharge Orders     None         Discharge Instructions Discussed with and Provided to Patient:   Discharge Instructions   None      Vicky Charleston, [place]ON[place] 12/23/23 2300    Garrick Charleston, MD 12/26/23 [phone]

## 2023-12-23 NOTE — ED Triage Notes (Signed)
 Pt to ED from home with c/o alcohol intoxication and AMS. Pt has been drinking wine and beer all day today. EMS was called due to increased confusion. VSS, NADN.

## 2023-12-28 ENCOUNTER — Ambulatory Visit (HOSPITAL_COMMUNITY): Payer: Self-pay | Admitting: Physician Assistant

## 2023-12-28 ENCOUNTER — Ambulatory Visit (INDEPENDENT_AMBULATORY_CARE_PROVIDER_SITE_OTHER)

## 2023-12-28 ENCOUNTER — Encounter (HOSPITAL_COMMUNITY): Payer: Self-pay

## 2023-12-28 ENCOUNTER — Ambulatory Visit (HOSPITAL_COMMUNITY)
Admission: EM | Admit: 2023-12-28 | Discharge: 2023-12-28 | Disposition: A | Discharge status: Home / Self Care | Attending: Physician Assistant | Admitting: Physician Assistant

## 2023-12-28 DIAGNOSIS — R052 Subacute cough: Secondary | ICD-10-CM | POA: Diagnosis not present

## 2023-12-28 DIAGNOSIS — J40 Bronchitis, not specified as acute or chronic: Secondary | ICD-10-CM | POA: Diagnosis not present

## 2023-12-28 DIAGNOSIS — Z720 Tobacco use: Secondary | ICD-10-CM | POA: Diagnosis not present

## 2023-12-28 DIAGNOSIS — J329 Chronic sinusitis, unspecified: Secondary | ICD-10-CM | POA: Diagnosis not present

## 2023-12-28 MED ORDER — ALBUTEROL SULFATE HFA 108 (90 BASE) MCG/ACT IN AERS
2.0000 | INHALATION_SPRAY | Freq: Once | RESPIRATORY_TRACT | Status: AC
  Administered 2023-12-28: 2 via RESPIRATORY_TRACT

## 2023-12-28 MED ORDER — ALBUTEROL SULFATE HFA 108 (90 BASE) MCG/ACT IN AERS
INHALATION_SPRAY | RESPIRATORY_TRACT | Status: AC
  Filled 2023-12-28: qty 6.7

## 2023-12-28 MED ORDER — PREDNISONE 20 MG PO TABS: 20.0000 mg | ORAL_TABLET | Freq: Every day | ORAL | 0 refills | Status: AC

## 2023-12-28 MED ORDER — PROMETHAZINE-DM 6.25-15 MG/5ML PO SYRP: 5.0000 mL | ORAL_SOLUTION | Freq: Two times a day (BID) | ORAL | 0 refills | Status: AC | PRN

## 2023-12-28 MED ORDER — AMOXICILLIN-POT CLAVULANATE 875-125 MG PO TABS: 1.0000 | ORAL_TABLET | Freq: Two times a day (BID) | ORAL | 0 refills | Status: AC

## 2023-12-28 NOTE — ED Triage Notes (Signed)
 Pt coming today form sinus and cough that has been going over 3 weeks. Has been taking over the counter medications with little relief.

## 2023-12-28 NOTE — ED Provider Notes (Signed)
 " [facility]    CSN: [id number] Arrival date & time: 12/28/23  0801      History   Chief Complaint Chief Complaint  Patient presents with   Cough    HPI Nancy Santiago is a 58 y.o. female.   Patient presents today with a several week history of persistent cough and congestion.  She reports initially she thought this was a mild cold but has been using over-the-counter medications without improvement of symptoms but over the past week has had worsening symptoms.  She reports significant cough, sinus pressure, postnasal drainage, sleep disturbance leading to fatigue.  She initially had a fever but this is since resolved.  She denies any chest pain or shortness of breath.  She has been taking Mucinex, Robitussin, Coricidin without improvement of symptoms.  Denies any recent antibiotics or steroids.  She has required albuterol inhaler when she was sick but denies formal diagnosis of asthma or COPD.  She does smoke.    Past Medical History:  Diagnosis Date   Anxiety    Anxiety    Depression     There are no active problems to display for this patient.   Past Surgical History:  Procedure Laterality Date   CESAREAN SECTION     HERNIA REPAIR      OB History   No obstetric history on file.      Home Medications    Prior to Admission medications  Medication Sig Start Date End Date Taking? Authorizing Provider  amoxicillin -clavulanate (AUGMENTIN ) 875-125 MG tablet Take 1 tablet by mouth every 12 (twelve) hours. 12/28/23  Yes Tyquasia Pant K, PA-C  predniSONE  (DELTASONE ) 20 MG tablet Take 1 tablet (20 mg total) by mouth daily for 5 days. 12/28/23 01/02/24 Yes Karliah Kowalchuk K, PA-C  promethazine-dextromethorphan (PROMETHAZINE-DM) 6.25-15 MG/5ML syrup Take 5 mLs by mouth 2 (two) times daily as needed for cough. 12/28/23  Yes Shameer Molstad, Rocky POUR, PA-C    Family History History reviewed. No pertinent family history.  Social History Social History[1]   Allergies    Patient has no known allergies.   Review of Systems Review of Systems  Constitutional:  Positive for activity change. Negative for appetite change, fatigue and fever.  HENT:  Positive for congestion, postnasal drip, sinus pressure and sore throat. Negative for sneezing.   Respiratory:  Positive for cough. Negative for shortness of breath.   Cardiovascular:  Negative for chest pain.  Gastrointestinal:  Negative for abdominal pain, diarrhea, nausea and vomiting.  Neurological:  Positive for headaches. Negative for dizziness and light-headedness.     Physical Exam Triage Vital Signs ED Triage Vitals [12/28/23 0818]  Encounter Vitals Group     BP 133/85     Girls Systolic BP Percentile      Girls Diastolic BP Percentile      Boys Systolic BP Percentile      Boys Diastolic BP Percentile      Pulse Rate 86     Resp 16     Temp 98.9 F (37.2 C)     Temp Source Oral     SpO2 96 %     Weight      Height      Head Circumference      Peak Flow      Pain Score      Pain Loc      Pain Education      Exclude from Growth Chart    No data found.  Updated Vital Signs BP  133/85 (BP Location: Right Arm)   Pulse 86   Temp 98.9 F (37.2 C) (Oral)   Resp 16   SpO2 96%   Visual Acuity Right Eye Distance:   Left Eye Distance:   Bilateral Distance:    Right Eye Near:   Left Eye Near:    Bilateral Near:     Physical Exam Vitals reviewed.  Constitutional:      General: She is awake. She is not in acute distress.    Appearance: Normal appearance. She is well-developed. She is not ill-appearing.     Comments: Very pleasant female appears stated age in no acute distress sitting comfortable in exam room  HENT:     Head: Normocephalic and atraumatic.     Right Ear: Tympanic membrane, ear canal and external ear normal. Tympanic membrane is not erythematous or bulging.     Left Ear: Tympanic membrane, ear canal and external ear normal. Tympanic membrane is not erythematous or  bulging.     Nose:     Right Sinus: Maxillary sinus tenderness present. No frontal sinus tenderness.     Left Sinus: Maxillary sinus tenderness present. No frontal sinus tenderness.     Mouth/Throat:     Pharynx: Uvula midline. Postnasal drip present. No oropharyngeal exudate or posterior oropharyngeal erythema.  Cardiovascular:     Rate and Rhythm: Normal rate and regular rhythm.     Heart sounds: Normal heart sounds, S1 normal and S2 normal. No murmur heard. Pulmonary:     Effort: Pulmonary effort is normal.     Breath sounds: Examination of the right-lower field reveals decreased breath sounds. Examination of the left-lower field reveals decreased breath sounds. Decreased breath sounds present. No wheezing, rhonchi or rales.     Comments: Reactive cough with deep breathing Psychiatric:        Behavior: Behavior is cooperative.      UC Treatments / Results  Labs (all labs ordered are listed, but only abnormal results are displayed) Labs Reviewed - No data to display  EKG   Radiology DG Chest 2 View Result Date: 12/28/2023 CLINICAL DATA:  acute cough EXAM: CHEST - 2 VIEW COMPARISON:  08/11/2013 FINDINGS: No focal airspace consolidation, pleural effusion, or pneumothorax. No cardiomegaly.No acute fracture or destructive lesion. IMPRESSION: No acute cardiopulmonary abnormality. Electronically Signed   By: Rogelia Myers M.D.   On: 12/28/2023 10:26    Procedures Procedures (including critical care time)  Medications Ordered in UC Medications  albuterol (VENTOLIN HFA) 108 (90 Base) MCG/ACT inhaler 2 puff (2 puffs Inhalation Given 12/28/23 0849)    Initial Impression / Assessment and Plan / UC Course  I have reviewed the triage vital signs and the nursing notes.  Pertinent labs & imaging results that were available during my care of the patient were reviewed by me and considered in my medical decision making (see chart for details).     Patient is well-appearing,  afebrile, nontoxic, nontachycardic.  Viral testing was deferred as patient has been symptomatic for several weeks and this would not change management.  Chest x-ray was obtained that showed parabronchial thickening without focal consolidation based on my primary read.  At the time of discharge reporting for radiologist over read we will contact her if this differs and changes her treatment plan.  Will start Augmentin  twice daily for 7 days to cover for sinobronchitis as well as COPD exacerbation given her history of tobacco use.  Will also start prednisone  20 mg for 5 days and we  discussed that she is not to take NSAIDs with this medication due to risk of GI bleeding.  She can use over-the-counter medications such as Tylenol , Mucinex, Flonase , nasal/sinus rinses for additional symptom relief.  She was given Promethazine DM for cough.  We discussed that this can be sedating and she should not drive or drink alcohol with taking it.  If she is not feeling better within 3 to 5 days she is to return for reevaluation.  If she has any worsening symptoms she needs to be seen immediately.  Return precautions given.  Excuse note provided.  Final Clinical Impressions(s) / UC Diagnoses   Final diagnoses:  Subacute cough  Sinobronchitis  Tobacco use     Discharge Instructions      I do not see an obvious pneumonia on your x-ray but we will contact you if the radiologist sees something that I did not.  Start Augmentin  twice daily.  Take prednisone  20 mg in the morning for 5 days.  Do not take NSAIDs with this medication including aspirin , ibuprofen /Advil , naproxen/Aleve.  Use albuterol every 4-6 hours as needed.  Take Promethazine DM for cough.  This will make you sleepy so do not drive or drink alcohol while taking it.  Use Mucinex, Flonase  nasal spray, nasal saline/sinus rinses for additional symptom relief.  I also recommend a humidifier in your room.  If your symptoms have not significantly improved within 3 to  5 days please return for reevaluation.  If anything worsens and you have high fever, worsening cough, shortness of breath, chest pain, weakness you need to go to the emergency room.     ED Prescriptions     Medication Sig Dispense Auth. Provider   promethazine-dextromethorphan (PROMETHAZINE-DM) 6.25-15 MG/5ML syrup Take 5 mLs by mouth 2 (two) times daily as needed for cough. 70 mL Irish Piech K, PA-C   predniSONE  (DELTASONE ) 20 MG tablet Take 1 tablet (20 mg total) by mouth daily for 5 days. 5 tablet Hamzeh Tall K, PA-C   amoxicillin -clavulanate (AUGMENTIN ) 875-125 MG tablet Take 1 tablet by mouth every 12 (twelve) hours. 14 tablet Riely Baskett K, PA-C      PDMP not reviewed this encounter.      [1]  Social History Tobacco Use   Smoking status: Every Day    Current packs/day: 0.50    Types: Cigarettes   Smokeless tobacco: Never  Substance Use Topics   Alcohol use: Yes    Comment: occasional   Drug use: No     Clotiel Troop, Rocky POUR, PA-C 12/28/23 1300  "

## 2023-12-28 NOTE — Discharge Instructions (Signed)
 I do not see an obvious pneumonia on your x-ray but we will contact you if the radiologist sees something that I did not.  Start Augmentin  twice daily.  Take prednisone  20 mg in the morning for 5 days.  Do not take NSAIDs with this medication including aspirin , ibuprofen /Advil , naproxen/Aleve.  Use albuterol  every 4-6 hours as needed.  Take Promethazine  DM for cough.  This will make you sleepy so do not drive or drink alcohol while taking it.  Use Mucinex, Flonase  nasal spray, nasal saline/sinus rinses for additional symptom relief.  I also recommend a humidifier in your room.  If your symptoms have not significantly improved within 3 to 5 days please return for reevaluation.  If anything worsens and you have high fever, worsening cough, shortness of breath, chest pain, weakness you need to go to the emergency room.
